# Patient Record
Sex: Female | Born: 1980 | Race: White | Hispanic: No | Marital: Single | Smoking: Never smoker
Health system: Southern US, Community
[De-identification: ages and names within clinical notes are randomized; demographics above are authoritative.]

## PROBLEM LIST (undated history)

## (undated) DIAGNOSIS — Q249 Congenital malformation of heart, unspecified: Secondary | ICD-10-CM

## (undated) DIAGNOSIS — I330 Acute and subacute infective endocarditis: Secondary | ICD-10-CM

## (undated) DIAGNOSIS — N289 Disorder of kidney and ureter, unspecified: Secondary | ICD-10-CM

## (undated) DIAGNOSIS — G253 Myoclonus: Secondary | ICD-10-CM

## (undated) DIAGNOSIS — Q513 Bicornate uterus: Secondary | ICD-10-CM

## (undated) HISTORY — PX: TUBAL LIGATION: SHX77

## (undated) HISTORY — PX: SPINAL GROWTH RODS: SHX2427

## (undated) HISTORY — PX: CHOLECYSTECTOMY: SHX55

## (undated) HISTORY — PX: CARDIAC SURGERY: SHX584

---

## 2015-01-09 ENCOUNTER — Emergency Department (HOSPITAL_COMMUNITY)
Admission: EM | Admit: 2015-01-09 | Discharge: 2015-01-09 | Disposition: A | Discharge status: Home / Self Care | Payer: PRIVATE HEALTH INSURANCE | Attending: Emergency Medicine | Admitting: Emergency Medicine

## 2015-01-09 ENCOUNTER — Encounter (HOSPITAL_COMMUNITY): Payer: Self-pay | Admitting: Emergency Medicine

## 2015-01-09 ENCOUNTER — Emergency Department (HOSPITAL_COMMUNITY): Payer: PRIVATE HEALTH INSURANCE

## 2015-01-09 DIAGNOSIS — S60121A Contusion of right index finger with damage to nail, initial encounter: Secondary | ICD-10-CM | POA: Insufficient documentation

## 2015-01-09 DIAGNOSIS — Q513 Bicornate uterus: Secondary | ICD-10-CM | POA: Insufficient documentation

## 2015-01-09 DIAGNOSIS — Z8669 Personal history of other diseases of the nervous system and sense organs: Secondary | ICD-10-CM | POA: Insufficient documentation

## 2015-01-09 DIAGNOSIS — W230XXA Caught, crushed, jammed, or pinched between moving objects, initial encounter: Secondary | ICD-10-CM | POA: Diagnosis not present

## 2015-01-09 DIAGNOSIS — Z8774 Personal history of (corrected) congenital malformations of heart and circulatory system: Secondary | ICD-10-CM | POA: Insufficient documentation

## 2015-01-09 DIAGNOSIS — S6010XA Contusion of unspecified finger with damage to nail, initial encounter: Secondary | ICD-10-CM

## 2015-01-09 DIAGNOSIS — Z87448 Personal history of other diseases of urinary system: Secondary | ICD-10-CM | POA: Insufficient documentation

## 2015-01-09 DIAGNOSIS — Y9389 Activity, other specified: Secondary | ICD-10-CM | POA: Diagnosis not present

## 2015-01-09 DIAGNOSIS — Z8679 Personal history of other diseases of the circulatory system: Secondary | ICD-10-CM | POA: Insufficient documentation

## 2015-01-09 DIAGNOSIS — Y9289 Other specified places as the place of occurrence of the external cause: Secondary | ICD-10-CM | POA: Diagnosis not present

## 2015-01-09 DIAGNOSIS — S6991XA Unspecified injury of right wrist, hand and finger(s), initial encounter: Secondary | ICD-10-CM | POA: Diagnosis present

## 2015-01-09 DIAGNOSIS — Y998 Other external cause status: Secondary | ICD-10-CM | POA: Diagnosis not present

## 2015-01-09 HISTORY — DX: Disorder of kidney and ureter, unspecified: N28.9

## 2015-01-09 HISTORY — DX: Myoclonus: G25.3

## 2015-01-09 HISTORY — DX: Acute and subacute infective endocarditis: I33.0

## 2015-01-09 HISTORY — DX: Congenital malformation of heart, unspecified: Q24.9

## 2015-01-09 HISTORY — DX: Bicornate uterus: Q51.3

## 2015-01-09 MED ORDER — OXYCODONE-ACETAMINOPHEN 5-325 MG PO TABS
1.0000 | ORAL_TABLET | Freq: Once | ORAL | Status: AC
  Administered 2015-01-09: 1 via ORAL
  Filled 2015-01-09: qty 1

## 2015-01-09 NOTE — ED Provider Notes (Signed)
CSN: [id number]     Arrival date & time 01/09/15  1349 History  By signing my name below, I, Soijett Blue, attest that this documentation has been prepared under the direction and in the presence of Catha Gosselin, PA-C Electronically Signed: Soijett Blue, ED Scribe. 01/09/2015. 3:17 PM.   Chief Complaint  Patient presents with  . Finger Injury    The history is provided by the patient. No language interpreter was used.    Shelby Cole is a 34 y.o. female with a medical hx of tetrology of fallot, DiGeorge syndrome who presents to the Emergency Department complaining of right index finger injury onset yesterday afternoon. She reports that she shut her finger in a car door that swung back and closed on her finger accidentally while she was getting items out of her car. She states that her right index finger is throbbing. She notes that she takes 5-325 mg percocet from a pain management clinic in [place] due to rods in back and  Osteoarthritis. Pt is having associated symptoms of bruising, right index swelling, and color change. She notes that she has not tried any medications for the relief of her symptoms. She denies wound, rash, and any other symptoms. She reports that she is on ASA 81 mg and digoxin.   Past Medical History  Diagnosis Date  . Heart abnormality     Tetrology of Fallot  . Myoclonia   . Renal disorder   . Uterus bicornis   . SBE (subacute bacterial endocarditis) (HCC)     With staphorious   Past Surgical History  Procedure Laterality Date  . Cardiac surgery    . Spinal growth rods    . Cholecystectomy    . Tubal ligation     History reviewed. No pertinent family history. Social History  Substance Use Topics  . Smoking status: None  . Smokeless tobacco: None  . Alcohol Use: None   OB History    No data available     Review of Systems  Musculoskeletal: Positive for joint swelling and arthralgias.  Skin: Positive for color change. Negative for rash  and wound.      Allergies  Morphine and related  Home Medications   Prior to Admission medications   Not on File   BP 134/83 mmHg  Pulse 87  Temp(Src) 98.1 F (36.7 C) (Oral)  Resp 18  SpO2 96%  LMP 01/02/2015 Physical Exam  Constitutional: She is oriented to person, place, and time. She appears well-developed and well-nourished. No distress.  HENT:  Head: Normocephalic and atraumatic.  Eyes: EOM are normal.  Neck: Neck supple.  Cardiovascular: Normal rate.   Pulmonary/Chest: Effort normal. No respiratory distress.  Abdominal: Soft. There is no tenderness.  Musculoskeletal: Normal range of motion.  Subungual hematoma of right index finger but no signs of cellulitis. Soft compartments. Erythema at the distal phalanx but able to flex and extend the finger. No active bleeding or laceration.   Neurological: She is alert and oriented to person, place, and time.  Skin: Skin is warm and dry.  Psychiatric: She has a normal mood and affect. Her behavior is normal.  Nursing note and vitals reviewed.   ED Course  Cauterization Date/Time: 01/09/2015 7:49 PM Performed by: Catha Gosselin Authorized by: Catha Gosselin Consent: Verbal consent obtained. Written consent obtained. Risks and benefits: risks, benefits and alternatives were discussed Consent given by: patient Patient understanding: patient states understanding of the procedure being performed Imaging studies: imaging studies available Patient identity confirmed:  verbally with patient Local anesthesia used: no Patient sedated: no Patient tolerance: Patient tolerated the procedure well with no immediate complications Comments: Right index finger.   (including critical care time) DIAGNOSTIC STUDIES: Oxygen Saturation is 96% on RA, nl by my interpretation.    COORDINATION OF CARE: 3:16 PM Discussed treatment plan with pt at bedside which includes right index finger xray and pt agreed to plan.    Labs  Review Labs Reviewed - No data to display  Imaging Review Dg Finger Index Right  01/09/2015  CLINICAL DATA:  Crush injury to right second finger in car door eat yesterday with pain and swelling. Initial encounter. EXAM: RIGHT INDEX FINGER 2+V COMPARISON:  None. FINDINGS: Soft tissue swelling present without acute fracture or dislocation. No arthropathy or bony lesions. No soft tissue foreign bodies identified. IMPRESSION: No acute fracture identified. Electronically Signed   By: Irish LackGlenn  Yamagata M.D.   On: 01/09/2015 14:56   I have personally reviewed and evaluated these images as part of my medical decision-making.   EKG Interpretation None      MDM   Final diagnoses:  Subungual hematoma of digit of hand, initial encounter  Patient presents after slamming her right index finger in the car door. She had a sublingual hematoma but no signs of compartment syndrome. Her x-ray was negative for fracture but did show soft tissue swelling. She had no signs of cellulitis. I discussed following up with her primary care physician upon [place] tomorrow. Mom and daughter both verbally agree with the plan. Medications  oxyCODONE-acetaminophen (PERCOCET/ROXICET) 5-325 MG per tablet 1 tablet (1 tablet Oral Given 01/09/15 1532)   I personally performed the services described in this documentation, which was scribed in my presence. The recorded information has been reviewed and is accurate.    Catha GosselinHanna Patel-Mills, PA-C 01/09/15 1951  Bethann BerkshireJoseph Zammit, MD 01/09/15 [phone]

## 2015-01-09 NOTE — ED Notes (Signed)
Patient was alert, oriented and stable upon discharge. RN went over AVS and patient had no further questions.  

## 2015-01-09 NOTE — ED Notes (Signed)
Pt shut finger in car door yesterday, finger got stuck. Today right index fingernail severely bruised/red/swollen. Family member states pt was born with Tetrology of Fallot, has had multiple surgeries on heart, family member concerned it may affect her heart rhythm.

## 2015-01-09 NOTE — Discharge Instructions (Signed)
Subungual Hematoma A subungual hematoma is a pocket of blood that collects under the fingernail or toenail. The pressure created by the blood under the nail can cause pain. CAUSES  A subungual hematoma occurs when an injury to the finger or toe causes a blood vessel beneath the nail to break. The injury can occur from a direct blow such as slamming a finger in a door. It can also occur from a repeated injury such as pressure on the foot in a shoe while running. A subungual hematoma is sometimes called runner's toe or tennis toe. SYMPTOMS   Blue or dark blue skin under the nail.  Pain or throbbing in the injured area. DIAGNOSIS  Your caregiver can determine whether you have a subungual hematoma based on your history and a physical exam. If your caregiver thinks you might have a broken (fractured) bone, X-rays may be taken. TREATMENT  Hematomas usually go away on their own over time. Your caregiver may make a hole in the nail to drain the blood. Draining the blood is painless and usually provides significant relief from pain and throbbing. The nail usually grows back normally after this procedure. In some cases, the nail may need to be removed. This is done if there is a cut under the nail that requires stitches (sutures). HOME CARE INSTRUCTIONS   Put ice on the injured area.  Put ice in a plastic bag.  Place a towel between your skin and the bag.  Leave the ice on for 15-20 minutes, 03-04 times a day for the first 1 to 2 days.  Elevate the injured area to help decrease pain and swelling.  If you were given a bandage, wear it for as long as directed by your caregiver.  If part of your nail falls off, trim the remaining nail gently. This prevents the nail from catching on something and causing further injury.  Only take over-the-counter or prescription medicines for pain, discomfort, or fever as directed by your caregiver. SEEK IMMEDIATE MEDICAL CARE IF:   You have redness or swelling  around the nail.  You have yellowish-white fluid (pus) coming from the nail.  Your pain is not controlled with medicine.  You have a fever. MAKE SURE YOU:  Understand these instructions.  Will watch your condition.  Will get help right away if you are not doing well or get worse.   This information is not intended to replace advice given to you by your health care provider. Make sure you discuss any questions you have with your health care provider.   Document Released: 02/23/2000 Document Revised: 05/20/2011 Document Reviewed: 07/13/2014 Elsevier Interactive Patient Education 2016 [employer]  

## 2015-10-23 DIAGNOSIS — Q213 Tetralogy of Fallot: Secondary | ICD-10-CM | POA: Insufficient documentation

## 2015-10-23 DIAGNOSIS — J984 Other disorders of lung: Secondary | ICD-10-CM | POA: Insufficient documentation

## 2015-10-23 DIAGNOSIS — G253 Myoclonus: Secondary | ICD-10-CM | POA: Insufficient documentation

## 2015-10-23 DIAGNOSIS — Q9381 Velo-cardio-facial syndrome: Secondary | ICD-10-CM | POA: Insufficient documentation

## 2016-07-18 DIAGNOSIS — Q600 Renal agenesis, unilateral: Secondary | ICD-10-CM | POA: Insufficient documentation

## 2017-08-03 ENCOUNTER — Emergency Department (HOSPITAL_COMMUNITY): Payer: Medicare Other

## 2017-08-03 ENCOUNTER — Emergency Department (HOSPITAL_COMMUNITY)
Admission: EM | Admit: 2017-08-03 | Discharge: 2017-08-03 | Disposition: A | Discharge status: Home / Self Care | Payer: Medicare Other | Attending: Emergency Medicine | Admitting: Emergency Medicine

## 2017-08-03 ENCOUNTER — Encounter (HOSPITAL_COMMUNITY): Payer: Self-pay | Admitting: Emergency Medicine

## 2017-08-03 DIAGNOSIS — N83299 Other ovarian cyst, unspecified side: Secondary | ICD-10-CM | POA: Diagnosis not present

## 2017-08-03 DIAGNOSIS — R1011 Right upper quadrant pain: Secondary | ICD-10-CM | POA: Diagnosis not present

## 2017-08-03 DIAGNOSIS — R7989 Other specified abnormal findings of blood chemistry: Secondary | ICD-10-CM | POA: Diagnosis not present

## 2017-08-03 DIAGNOSIS — R109 Unspecified abdominal pain: Secondary | ICD-10-CM

## 2017-08-03 DIAGNOSIS — N83209 Unspecified ovarian cyst, unspecified side: Secondary | ICD-10-CM

## 2017-08-03 LAB — URINALYSIS, ROUTINE W REFLEX MICROSCOPIC
BILIRUBIN URINE: NEGATIVE
GLUCOSE, UA: NEGATIVE mg/dL
Hgb urine dipstick: NEGATIVE
KETONES UR: 5 mg/dL — AB
NITRITE: NEGATIVE
PH: 5 (ref 5.0–8.0)
Protein, ur: 30 mg/dL — AB
SPECIFIC GRAVITY, URINE: 1.02 (ref 1.005–1.030)

## 2017-08-03 LAB — COMPREHENSIVE METABOLIC PANEL
ALK PHOS: 65 U/L (ref 38–126)
ALT: 38 U/L (ref 14–54)
AST: 28 U/L (ref 15–41)
Albumin: 4.4 g/dL (ref 3.5–5.0)
Anion gap: 12 (ref 5–15)
BUN: 16 mg/dL (ref 6–20)
CHLORIDE: 103 mmol/L (ref 101–111)
CO2: 25 mmol/L (ref 22–32)
CREATININE: 1.83 mg/dL — AB (ref 0.44–1.00)
Calcium: 9 mg/dL (ref 8.9–10.3)
GFR calc Af Amer: 40 mL/min — ABNORMAL LOW (ref >60)
GFR calc non Af Amer: 34 mL/min — ABNORMAL LOW (ref >60)
Glucose, Bld: 113 mg/dL — ABNORMAL HIGH (ref 65–99)
Potassium: 4.4 mmol/L (ref 3.5–5.1)
SODIUM: 140 mmol/L (ref 135–145)
Total Bilirubin: 0.7 mg/dL (ref 0.3–1.2)
Total Protein: 7.9 g/dL (ref 6.5–8.1)

## 2017-08-03 LAB — I-STAT BETA HCG BLOOD, ED (MC, WL, AP ONLY): I-stat hCG, quantitative: <5 m[IU]/mL (ref <5)

## 2017-08-03 LAB — CBC
HCT: 34 % — ABNORMAL LOW (ref 36.0–46.0)
Hemoglobin: 9.6 g/dL — ABNORMAL LOW (ref 12.0–15.0)
MCH: 22.9 pg — AB (ref 26.0–34.0)
MCHC: 28.2 g/dL — ABNORMAL LOW (ref 30.0–36.0)
MCV: 81 fL (ref 78.0–100.0)
PLATELETS: 150 10*3/uL (ref 150–400)
RBC: 4.2 MIL/uL (ref 3.87–5.11)
RDW: 18.7 % — AB (ref 11.5–15.5)
WBC: 4.6 10*3/uL (ref 4.0–10.5)

## 2017-08-03 LAB — LIPASE, BLOOD: LIPASE: 51 U/L (ref 11–51)

## 2017-08-03 MED ORDER — ONDANSETRON HCL 4 MG/2ML IJ SOLN
4.0000 mg | Freq: Once | INTRAMUSCULAR | Status: AC
  Administered 2017-08-03: 4 mg via INTRAVENOUS
  Filled 2017-08-03: qty 2

## 2017-08-03 MED ORDER — KETOROLAC TROMETHAMINE 30 MG/ML IJ SOLN
30.0000 mg | Freq: Once | INTRAMUSCULAR | Status: AC
  Administered 2017-08-03: 30 mg via INTRAVENOUS
  Filled 2017-08-03: qty 1

## 2017-08-03 MED ORDER — IBUPROFEN 800 MG PO TABS: 800.0000 mg | ORAL_TABLET | Freq: Three times a day (TID) | ORAL | 0 refills | Status: AC | PRN

## 2017-08-03 MED ORDER — SODIUM CHLORIDE 0.9 % IV BOLUS
1000.0000 mL | Freq: Once | INTRAVENOUS | Status: AC
  Administered 2017-08-03: 1000 mL via INTRAVENOUS

## 2017-08-03 NOTE — ED Notes (Signed)
Bed: ZO10 Expected date:  Expected time:  Means of arrival:  Comments: Hold Storlie

## 2017-08-03 NOTE — ED Provider Notes (Signed)
Emergency Department Provider Note   I have reviewed the triage vital signs and the nursing notes.   HISTORY  Chief Complaint Abdominal Pain; Diarrhea; and Nausea   HPI Shelby Cole is a 37 y.o. female with PMH of TOF s/p repair, vWF, and SBE resents to the emergency department for evaluation of right-sided abdominal/flank pain worsening over the past 2 days.  The patient scribes a sudden onset, constant pain which is radiating slightly around the inguinal crease.  No prior history of kidney stone.  She has had multiple surgeries of her heart and is status post cholecystectomy.  She does have an appendix.  Denies any fevers or chills.  She did have some associated diarrhea and gas but is passing that normally.  No blood in the diarrhea.  No associated vomiting.  No chest pain or dyspnea.  Past Medical History:  Diagnosis Date  . Heart abnormality    Tetrology of Fallot  . Myoclonia   . Renal disorder   . SBE (subacute bacterial endocarditis)    With staphorious  . Uterus bicornis     There are no active problems to display for this patient.   Past Surgical History:  Procedure Laterality Date  . CARDIAC SURGERY    . CHOLECYSTECTOMY    . SPINAL GROWTH RODS    . TUBAL LIGATION        Allergies Morphine and related  No family history on file.  Social History Social History   Tobacco Use  . Smoking status: Never Smoker  . Smokeless tobacco: Never Used  Substance Use Topics  . Alcohol use: Not on file  . Drug use: Not on file    Review of Systems  Constitutional: No fever/chills Eyes: No visual changes. ENT: No sore throat. Cardiovascular: Denies chest pain. Respiratory: Denies shortness of breath. Gastrointestinal: Positive RLQ abdominal pain.  No nausea, no vomiting. Positive diarrhea.  No constipation. Genitourinary: Negative for dysuria. No vaginal discharge.  Musculoskeletal: Negative for back pain. Skin: Negative for rash. Neurological: Negative  for headaches, focal weakness or numbness.  10-point ROS otherwise negative.  ____________________________________________   PHYSICAL EXAM:  VITAL SIGNS: ED Triage Vitals  Enc Vitals Group     BP 08/03/17 1609 127/78     Pulse Rate 08/03/17 1609 (!) 102     Resp 08/03/17 1609 16     Temp 08/03/17 1609 98.3 F (36.8 C)     Temp Source 08/03/17 1609 Oral     SpO2 08/03/17 1609 95 %     Weight 08/03/17 1609 138 lb (62.6 kg)     Height 08/03/17 1609  (1.626 m)     Pain Score 08/03/17 1619 10   Constitutional: Alert and oriented. Well appearing and in no acute distress. Eyes: Conjunctivae are normal.  Head: Atraumatic. Nose: No congestion/rhinnorhea. Mouth/Throat: Mucous membranes are moist. Neck: No stridor.  Cardiovascular: Sinus tachycardia. Good peripheral circulation. Grossly normal heart sounds.   Respiratory: Normal respiratory effort.  No retractions. Lungs CTAB. Gastrointestinal: Soft with mild RLQ tenderness without rebound or guarding. Mild right CVA tenderness to percussion. No distention.  Musculoskeletal: No lower extremity tenderness nor edema. No gross deformities of extremities. Neurologic:  Normal speech and language. No gross focal neurologic deficits are appreciated.  Skin:  Skin is warm, dry and intact. No rash noted.  ____________________________________________   LABS (all labs ordered are listed, but only abnormal results are displayed)  Labs Reviewed  COMPREHENSIVE METABOLIC PANEL - Abnormal; Notable for the following  components:      Result Value   Glucose, Bld 113 (*)    Creatinine, Ser 1.83 (*)    GFR calc non Af Amer 34 (*)    GFR calc Af Amer 40 (*)    All other components within normal limits  CBC - Abnormal; Notable for the following components:   Hemoglobin 9.6 (*)    HCT 34.0 (*)    MCH 22.9 (*)    MCHC 28.2 (*)    RDW 18.7 (*)    All other components within normal limits  URINALYSIS, ROUTINE W REFLEX MICROSCOPIC - Abnormal;  Notable for the following components:   Ketones, ur 5 (*)    Protein, ur 30 (*)    Leukocytes, UA TRACE (*)    Bacteria, UA RARE (*)    All other components within normal limits  LIPASE, BLOOD  I-STAT BETA HCG BLOOD, ED ([facility], [facility], [facility] ONLY)   ____________________________________________  RADIOLOGY  Ct Renal Stone Study  Result Date: 08/03/2017 CLINICAL DATA:  Right-sided abdominal pain. EXAM: CT ABDOMEN AND PELVIS WITHOUT CONTRAST TECHNIQUE: Multidetector CT imaging of the abdomen and pelvis was performed following the standard protocol without oral or IV contrast. COMPARISON:  None. FINDINGS: Lower chest: Lung bases are clear. Hepatobiliary: No focal liver lesions are evident on this noncontrast enhanced study. Gallbladder is absent. There is no biliary duct dilatation. Pancreas: No pancreatic mass or inflammatory focus. Spleen: No splenic lesions are evident. 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 There is a solid structure in the right renal fossa measuring 1.1 x 0.8 cm which may represent minimal residual focus of atrophic right kidney. No recognizable renal tissue evident. There is compensatory hypertrophy of the left kidney. No hydronephrosis or mass on the left. No left-sided renal or ureteral calculus. Urinary bladder is midline. Urinary bladder wall is mildly thickened. There is a urachal remnant without mass or inflammatory change involving this urachal remnant. Stomach/Bowel: There is no appreciable bowel wall or mesenteric thickening. No evident bowel obstruction. No free air or portal venous air. Vascular/Lymphatic: There are foci of atherosclerotic calcification in the aorta and right common iliac artery. No aneurysm evident. Major mesenteric arterial vessels appear patent. There is no appreciable adenopathy in the abdomen or pelvis. Reproductive: Uterus is anteverted. No evident pelvic mass. There is moderate free fluid in the cul-de-sac. Other: Appendix appears unremarkable. No abscess in the  abdomen or pelvis. There is a small ventral hernia containing only fat. Musculoskeletal: There is extensive fixation in the lower thoracic and lumbar regions. There is thoracolumbar levoscoliosis. No evident blastic or lytic bone lesions. No intramuscular or abdominal wall lesions are evident. IMPRESSION: 1. Virtually absent right kidney. A 1.1 x 0.7 cm solid focus is noted in the right pararenal fossa, likely slight residua of atrophic right kidney. No recognizable renal tissue in this area seen. 2. Compensatory hypertrophy left kidney. No left renal mass or hydronephrosis. No left renal or ureteral calculus. 3. Suspect a degree of cystitis with urinary bladder wall thickening. 4. Urachal remnant without cyst or mass. No inflammatory focus in this area. 5.  No bowel obstruction.  No abscess.  Appendix appears normal. 6.  Aortic atherosclerosis. 7.  Free fluid in cul-de-sac.  Question recent ovarian cyst rupture. 8.  Small ventral hernia containing only fat. 9.  Gallbladder absent. 10. Thoracolumbar levoscoliosis with extensive postoperative change in the lower thoracic and lumbar regions. Aortic Atherosclerosis (ICD10-I70.0). Electronically Signed   By: Bretta Bang III M.D.   On: 08/03/2017  18:17    ____________________________________________   PROCEDURES  Procedure(s) performed:   Procedures  None ____________________________________________   INITIAL IMPRESSION / ASSESSMENT AND PLAN / ED COURSE  Pertinent labs & imaging results that were available during my care of the patient were reviewed by me and considered in my medical decision making (see chart for details).  Patient presents to the emergency department for evaluation of right flank discomfort starting 2 days ago which has been constant.  She is afebrile here with mild tachycardia.  Her exam is positive for mild right CVA tenderness and mild right lower quadrant discomfort. Plan for CT renal protocol for further evaluation. Will  treat with Toradol and IVF. Labs pending.   06:30 PM Patient CT imaging and lab work.  The patient's creatinine is elevated from her 2018 values.  BUN is normal.  Electrolytes normal.  Suspect developing chronic kidney disease.  I discussed this with the patient and mother in detail.  They will call their cardiology group on Tuesday to schedule an urgent outpatient appointment and additional blood testing.  Patient will resume taking her home pain medications.  Suspect ovarian cyst rupture with fluid in the pelvis.  Normal appendix.   At this time, I do not feel there is any life-threatening condition present. I have reviewed and discussed all results (EKG, imaging, lab, urine as appropriate), exam findings with patient. I have reviewed nursing notes and appropriate previous records.  I feel the patient is safe to be discharged home without further emergent workup. Discussed usual and customary return precautions. Patient and family (if present) verbalize understanding and are comfortable with this plan.  Patient will follow-up with their primary care provider. If they do not have a primary care provider, information for follow-up has been provided to them. All questions have been answered.  ____________________________________________  FINAL CLINICAL IMPRESSION(S) / ED DIAGNOSES  Final diagnoses:  Right flank pain  Elevated serum creatinine  Ruptured ovarian cyst     MEDICATIONS GIVEN DURING THIS VISIT:  Medications  sodium chloride 0.9 % bolus 1,000 mL (0 mLs Intravenous Stopped 08/03/17 1901)  ketorolac (TORADOL) 30 MG/ML injection 30 mg (30 mg Intravenous Given 08/03/17 1740)  ondansetron (ZOFRAN) injection 4 mg (4 mg Intravenous Given 08/03/17 1740)     NEW OUTPATIENT MEDICATIONS STARTED DURING THIS VISIT:  Discharge Medication List as of 08/03/2017  6:50 PM    START taking these medications   Details  ibuprofen (ADVIL,MOTRIN) 800 MG tablet Take 1 tablet (800 mg total) by mouth  every 8 (eight) hours as needed for up to 3 days for moderate pain., Starting Sun 08/03/2017, Until Wed 08/06/2017, Print        Note:  This document was prepared using Dragon voice recognition software and may include unintentional dictation errors.  Alona Bene, MD Emergency Medicine    Long, Arlyss Repress, MD 08/04/17 [phone]

## 2017-08-03 NOTE — ED Triage Notes (Signed)
Pt c/o right abd pains since Thursday night that is worse with movements and sitting certain ways. Had nausea and diarrhea. Hx gallbladder removed.  Had period last week. Pt on opana for chronic pain and not helping the abd pains.

## 2017-08-03 NOTE — Discharge Instructions (Addendum)
You have been seen in the Emergency Department (ED) for abdominal pain.  Your evaluation did not identify a clear cause of your symptoms but was generally reassuring. You likely have a ruptured ovarian cyst. Follow up with your Gyn provider for this. Take Motrin only as needed for a very short period of time. Your Kidney function is worse when compared to prior. You will need to call your PCP and Cardiologist to review this lab abnormality ASAP.   Please follow up as instructed above regarding today?s emergent visit and the symptoms that are bothering you.  Return to the ED if your abdominal pain worsens or fails to improve, you develop bloody vomiting, bloody diarrhea, you are unable to tolerate fluids due to vomiting, fever greater than 101, or other symptoms that concern you.

## 2017-08-21 DIAGNOSIS — K089 Disorder of teeth and supporting structures, unspecified: Secondary | ICD-10-CM | POA: Insufficient documentation

## 2018-10-05 DIAGNOSIS — H903 Sensorineural hearing loss, bilateral: Secondary | ICD-10-CM | POA: Insufficient documentation

## 2018-12-08 DIAGNOSIS — D6800 Von Willebrand disease, unspecified: Secondary | ICD-10-CM | POA: Insufficient documentation

## 2019-07-13 DIAGNOSIS — I50813 Acute on chronic right heart failure: Secondary | ICD-10-CM | POA: Insufficient documentation

## 2019-07-13 DIAGNOSIS — I519 Heart disease, unspecified: Secondary | ICD-10-CM | POA: Insufficient documentation

## 2019-08-25 ENCOUNTER — Encounter: Payer: Self-pay | Admitting: Gastroenterology

## 2019-09-29 ENCOUNTER — Encounter: Payer: Self-pay | Admitting: Gastroenterology

## 2019-09-29 ENCOUNTER — Ambulatory Visit (INDEPENDENT_AMBULATORY_CARE_PROVIDER_SITE_OTHER): Payer: Medicare Other | Admitting: Gastroenterology

## 2019-09-29 VITALS — BP 112/82 | HR 76 | Ht 63.5 in | Wt 161.0 lb

## 2019-09-29 DIAGNOSIS — K5900 Constipation, unspecified: Secondary | ICD-10-CM

## 2019-09-29 DIAGNOSIS — R1310 Dysphagia, unspecified: Secondary | ICD-10-CM | POA: Diagnosis not present

## 2019-09-29 MED ORDER — LUBIPROSTONE 8 MCG PO CAPS
8.0000 ug | ORAL_CAPSULE | Freq: Two times a day (BID) | ORAL | 2 refills | Status: DC
Start: 2019-09-29 — End: 2019-11-09

## 2019-09-29 NOTE — Progress Notes (Signed)
09/29/2019 Shelby Cole [id number] 19-Jul-1980   HISTORY OF PRESENT ILLNESS: This is a 39 year old female who is new to our office.  She has been referred here by her cardiologist at [facility], Dr. Deatra James, for evaluation regarding constipation.  She is here today with her mother.  Medical history as listed below.  Had tetralogy of Fallot with surgeries related to that.  Has also had cholecystectomy, tubal ligation, and back surgery for spinal growth rods.  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  They say that shortly after the procedure she started having these episodes of significant abdominal muscle twitching that looks as if she has a baby or an alien in her abdomen.  This causes pain.  Ultimately she saw a neurologist who diagnosed this and she has been on Keppra, which seems to control her symptoms from this for the most part.  She was told to never have a colonoscopy again.  She says that she seems to have problems with this whenever she eats too much at a time/eats big meals.    Anyway, she battles constipation.  She says that she when she gets constipated she has gets a lot of stomach pain.  She says that a couple of times a month sometimes she will get episodes of pain that are so bad that she almost passes out.  She says that she will get hot sweats and stuff as well.  She says her abdomen gets very distended and bloated.  She had taken MiraLAX for a long time, but it stopped working.  She now takes 2 Senokot and 3 stool softeners every night.  Even with that sometimes she has 3 days at a time where she does not have a bowel movement.  Then she takes milk of magnesia, which does seem to work.  She does  require and take chronic narcotics/opioids.  She also reports issues with swallowing.  She reports frequently clearing her throat.  She is on omeprazole 20 mg twice daily.  She admits to food and pills intermittently getting stuck.  She sees a [facility] and they have been able to push on the back of her neck and show her were to manipulate that and when she does that it seems that the food tends to go down and seems like the esophagus opens up.  She had a CT scan of the abdomen and pelvis without contrast in May 2019 that did not show any type of bowel/gastrointestinal issues.  It also appears that she had an EGD in April 2013 as well, but I only have pathology results from that procedure.  Biopsies of the esophagus showed benign squamous epithelium with no pathologic diagnosis, negative for eosinophilic esophagitis.  Past Medical History:  Diagnosis Date  . Heart abnormality    Tetrology of Fallot  . Myoclonia   . Renal disorder   . SBE (subacute bacterial endocarditis)    With staphorious  . Uterus bicornis    Past Surgical History:  Procedure Laterality Date  . CARDIAC SURGERY    . CHOLECYSTECTOMY    . SPINAL GROWTH RODS    . TUBAL LIGATION      reports that she has never smoked.  She has never used smokeless tobacco. She reports current alcohol use. She reports that she does not use drugs. family history is not on file. Allergies  Allergen Reactions  . Morphine And Related Other (See Comments)    Hallucination Hallucination      Outpatient Encounter Medications as of 09/29/2019  Medication Sig  . aspirin 81 MG chewable tablet Chew 81 mg by mouth daily.  . citalopram (CELEXA) 40 MG tablet Take 40 mg by mouth daily.  . cyclobenzaprine (FLEXERIL) 5 MG tablet Take 5 mg by mouth at bedtime as needed for pain.  Marland Kitchen levETIRAcetam (KEPPRA) 500 MG tablet Take 500 mg by mouth 3 (three) times daily.  Marland Kitchen lidocaine (LIDODERM) 5 % Place 1 patch onto the skin daily. Remove & Discard patch  within 12 hours or as directed by MD  . lisinopril (PRINIVIL,ZESTRIL) 5 MG tablet Take by mouth daily.   Marland Kitchen loratadine (CLARITIN) 10 MG tablet Take 10 mg by mouth daily.  . metoprolol-hydrochlorothiazide (LOPRESSOR HCT) 100-25 MG tablet Take 1 tablet by mouth daily.  Marland Kitchen omeprazole (PRILOSEC) 20 MG capsule Take 20 mg by mouth 2 (two) times daily.  Marland Kitchen oxymorphone (OPANA ER) 15 MG 12 hr tablet Take 15 mg by mouth every 12 (twelve) hours.  . ropinirole (REQUIP) 5 MG tablet Take 5 mg by mouth at bedtime.  Marland Kitchen spironolactone (ALDACTONE) 50 MG tablet Take 50 mg by mouth daily.  Marland Kitchen torsemide (DEMADEX) 20 MG tablet Take 20 mg by mouth daily. 2-3 times a day  . [DISCONTINUED] diclofenac sodium (VOLTAREN) 1 % GEL Apply 2 g topically 4 (four) times daily.  . [DISCONTINUED] furosemide (LASIX) 40 MG tablet Take 40 mg by mouth daily.  . [DISCONTINUED] oxyCODONE-acetaminophen (PERCOCET/ROXICET) 5-325 MG tablet Take 1 tablet by mouth 3 (three) times daily as needed for pain.  . [DISCONTINUED] venlafaxine XR (EFFEXOR-XR) 75 MG 24 hr capsule Take 75 mg by mouth daily.   No facility-administered encounter medications on file as of 09/29/2019.     REVIEW OF SYSTEMS  : All other systems reviewed and negative except where noted in the History of Present Illness.   PHYSICAL EXAM: BP 112/82   Pulse 76   Ht 5' 3.5" (1.613 m)   Wt 161 lb (73 kg)   BMI 28.07 kg/m  General: Well developed white female in no acute distress Head: Normocephalic and atraumatic Eyes:  Sclerae anicteric, conjunctiva pink. Ears: Normal auditory acuity Lungs: Clear throughout to auscultation; no increased WOB. Heart: Regular rate and rhythm; no M/R/G. Abdomen: Soft, minimal if any abdominal distention currently.  BS present.  Non-tender. Musculoskeletal: Symmetrical with no gross deformities  Skin: No lesions on visible extremities Extremities: No edema  Neurological: Alert oriented x 4, grossly non-focal Psychological:  Alert and  cooperative. Normal mood and affect  ASSESSMENT AND PLAN: *Dysphagia:  Had an EGD in 2013 and from what I can tell there was no source of her dysphagia identified.  She describes being able to push on the back of her neck to allow food to go down when she has episodes of dysphagia.  Sounds like it could be some extrinsic compression, question from hardware from previous surgery.  We will plan for barium esophagram. *Chronic constipation: Undoubtedly secondary to her chronic narcotic/opioid use.  Used MiraLAX for years.  Currently using senna and Colace.  She describes episodes of severe abdominal pain when she gets really constipated that cause her to have sweating, headaches, and abdominal distention.  These resolve after being able  to move her bowels.  Question if she is getting intermittent partial small bowel obstructions.  She has never been diagnosed or treated for such, however.  We will try Amitiza 8 mcg twice daily with food for her constipation.  Can titrate up if needed.  Prescription sent to pharmacy. 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  Sounds like this is a neurologic/muscular disorder and not a gastrointestinal disorder per se.  She was being treated by a neurologist for this in the past and continues on Keppra, which they think does help.  They are convinced that this was triggered by her colonoscopy that she had 2013.  She describes that these episodes of muscle twitching in her abdomen that cause pain are worsened when she is constipated or eats too much food at one time/over distends her abdomen, etc.  Advised to continue her current treatment with the Keppra per her PCP who is now prescribing that.  Advised that we will treat her constipation and that she should eat small frequent meals to try to avoid episodes of this.   CC:  Lottie Mussel*

## 2019-09-29 NOTE — Patient Instructions (Addendum)
If you are age 39 or older, your body mass index should be between 23-30. Your Body mass index is 28.07 kg/m. If this is out of the aforementioned range listed, please consider follow up with your Primary Care Provider.  If you are age 73 or younger, your body mass index should be between 19-25. Your Body mass index is 28.07 kg/m. If this is out of the aformentioned range listed, please consider follow up with your Primary Care Provider.   You have been scheduled for a Barium Esophogram at [facility] (1st floor of the hospital) on 10/07/19 at 9:30 am. Please arrive 15 minutes prior to your appointment for registration. Make certain not to have anything to eat or drink starting at midnight prior to your test. If you need to reschedule for any reason, please contact radiology at [phone] to do so. __________________________________________________________________ A barium swallow is an examination that concentrates on views of the esophagus. This tends to be a double contrast exam (barium and two liquids which, when combined, create a gas to distend the wall of the oesophagus) or single contrast (non-ionic iodine based). The study is usually tailored to your symptoms so a good history is essential. Attention is paid during the study to the form, structure and configuration of the esophagus, looking for functional disorders (such as aspiration, dysphagia, achalasia, motility and reflux) EXAMINATION You may be asked to change into a gown, depending on the type of swallow being performed. A radiologist and radiographer will perform the procedure. The radiologist will advise you of the type of contrast selected for your procedure and direct you during the exam. You will be asked to stand, sit or lie in several different positions and to hold a small amount of fluid in your mouth before being asked to swallow while the imaging is performed .In some instances you may be asked to swallow barium  coated marshmallows to assess the motility of a solid food bolus. The exam can be recorded as a digital or video fluoroscopy procedure. POST PROCEDURE It will take 1-2 days for the barium to pass through your system. To facilitate this, it is important, unless otherwise directed, to increase your fluids for the next 24-48hrs and to resume your normal diet.  This test typically takes about 30 minutes to perform. __________________________________________________________________________________  START Amitiza 8 mcg (we have sent in the generic), 1 capsule twice a day.  You have been scheduled to follow up with Dr. Christain Sacramento on November 29, 2019 at 9:50 am.

## 2019-10-07 ENCOUNTER — Ambulatory Visit (HOSPITAL_COMMUNITY)
Admission: RE | Admit: 2019-10-07 | Discharge: 2019-10-07 | Disposition: A | Discharge status: Home / Self Care | Payer: Medicare Other | Source: Ambulatory Visit | Attending: Gastroenterology | Admitting: Gastroenterology

## 2019-10-07 ENCOUNTER — Other Ambulatory Visit: Payer: Self-pay

## 2019-10-07 DIAGNOSIS — K5900 Constipation, unspecified: Secondary | ICD-10-CM | POA: Insufficient documentation

## 2019-10-07 DIAGNOSIS — R1310 Dysphagia, unspecified: Secondary | ICD-10-CM | POA: Diagnosis present

## 2019-11-01 NOTE — Progress Notes (Signed)
Reviewed and agree with management plans. ? ?Baneen Wieseler L. Camila Maita, MD, MPH  ?

## 2019-11-05 ENCOUNTER — Telehealth: Payer: Self-pay | Admitting: Gastroenterology

## 2019-11-05 NOTE — Telephone Encounter (Signed)
Spoke with patient, she states that Amitiza works and then it doesn't. Pt states that she notices that it works better in the mornings. She reports that her stools are hard can caused some bleeding "like her period". Pt states that she was previously taking Senokot and Miralax which was not helpful consistently either, still having constipation. Pt also states that she never heard anything about her barium swallow study results. Please advise, thanks

## 2019-11-05 NOTE — Telephone Encounter (Signed)
Pt is wanting to inform the nurse that the Amitiza is not working for her since she is still getting constipated.

## 2019-11-08 ENCOUNTER — Telehealth: Payer: Self-pay | Admitting: Gastroenterology

## 2019-11-08 NOTE — Telephone Encounter (Signed)
See alternate phone note  

## 2019-11-08 NOTE — Telephone Encounter (Signed)
Shelby Cole I think this is your patient, if you can relay results at your convenience. thanks

## 2019-11-08 NOTE — Telephone Encounter (Signed)
FYI

## 2019-11-08 NOTE — Telephone Encounter (Signed)
Dr. Adela Lank as DOD 11/08/19 please see below and advise.  Shelby Cole's patient history of constipation and dysphagia - see below for details. Mom is calling today for barium swallow study results that were done on 10/07/19. Thanks

## 2019-11-08 NOTE — Telephone Encounter (Signed)
I have reviewed the recent evaluation by Doug Sou.  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  - Mild nonspecific intermittent esophageal dysmotility.  - Tiny sliding hiatal hernia.  - Small volume gastroesophageal reflux to the level of the lower esophagus.  - No evidence of significant fixed stricture. The patient swallowed a 13 mm barium tablet, which freely passed into the stomach.  Given these results I recommend: - Incresing omeprazole to 40 mg BID - Review GERD lifestyle modications - Referral to ENT at [facility] or [facility] to determine if Zenker's may be the source of her dysphagia and for recommendations about possible surgery   In regards to the constipation, Shelby Cole's notes mention starting Amitiza at 8 mcg. Please confirm that this is the dose, and if so, she should increase the dose to 24 mcg BID.  She should be following a high fiber diet, drinking at least 64 ounces of water daily, and using Miralax.   She has follow-up appointment next month. If she feels an earlier appointment is necessary, please see if Shelby Cole has anything available.  Thank you.

## 2019-11-09 MED ORDER — OMEPRAZOLE 40 MG PO CPDR: 40.0000 mg | DELAYED_RELEASE_CAPSULE | Freq: Two times a day (BID) | ORAL | 6 refills | Status: DC

## 2019-11-09 MED ORDER — LUBIPROSTONE 24 MCG PO CAPS: 24.0000 ug | ORAL_CAPSULE | Freq: Two times a day (BID) | ORAL | 6 refills | Status: AC

## 2019-11-09 NOTE — Telephone Encounter (Signed)
The pt's mother has been advised and new prescriptions has been sent to the pharmacy for amitiza and omeprazole.  She also states that she has seen an ENT and will have those records faxed to [facility] for review.  She will keep the appt as planned for next month.

## 2019-11-09 NOTE — Telephone Encounter (Signed)
Thank you for addressing this.  In regards to the esophagram, there was nothing acute that needed to be addressed and I saw that she had a follow-up appointment.  The results were released in my chart and it looks like she is signed up for my chart so I figured if she had any specific questions or further complaints then she would contact us in the interim, but otherwise the results would be discussed at her follow-up.  Thank you for addressing the constipation.

## 2019-11-09 NOTE — Telephone Encounter (Signed)
Left message on machine to call back  

## 2019-11-29 ENCOUNTER — Encounter: Payer: Self-pay | Admitting: Gastroenterology

## 2019-11-29 ENCOUNTER — Other Ambulatory Visit: Payer: Self-pay | Admitting: Gastroenterology

## 2019-11-29 ENCOUNTER — Telehealth: Payer: Self-pay | Admitting: Gastroenterology

## 2019-11-29 ENCOUNTER — Ambulatory Visit (INDEPENDENT_AMBULATORY_CARE_PROVIDER_SITE_OTHER): Payer: Medicare Other | Admitting: Gastroenterology

## 2019-11-29 ENCOUNTER — Ambulatory Visit: Payer: No Typology Code available for payment source | Admitting: Gastroenterology

## 2019-11-29 VITALS — BP 110/70 | HR 88 | Ht 62.75 in | Wt 164.2 lb

## 2019-11-29 DIAGNOSIS — R1310 Dysphagia, unspecified: Secondary | ICD-10-CM | POA: Diagnosis not present

## 2019-11-29 MED ORDER — NALOXEGOL OXALATE 12.5 MG PO TABS: 12.5000 mg | ORAL_TABLET | Freq: Every day | ORAL | 1 refills | Status: DC

## 2019-11-29 NOTE — H&P (View-Only) (Signed)
Duplicate note started

## 2019-11-29 NOTE — Progress Notes (Signed)
Duplicate note started

## 2019-11-29 NOTE — Telephone Encounter (Signed)
I am sorry that she can't come on Wednesday. Please place her on a cancellation list. Thank you.

## 2019-11-29 NOTE — Patient Instructions (Addendum)
I have recommended esophageal manometry to further evaluated your trouble swallowing. 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  I have recommended a trial of naloxegol 12.5 mg daily. Please stop this medication if you develop abdominal pain, diarrhea, or any new concerning symptoms and contact me.  We have sent the following medications to your pharmacy for you to pick up at your convenience:  START: naloxegol 12.5mg  daily  Let's plan to see each other again after you see the ENT.  You have been scheduled for an esophageal manometry test at [facility] on 12-13-19 at 12:30pm. Please arrive 30 minutes prior to your procedure for registration. You will need to go to outpatient registration (1st floor of the hospital) first. Make certain to bring your insurance cards as well as a complete list of medications.  Please remember the following:  1) Do not take any muscle relaxants, xanax (alprazolam) or ativan for 1 day prior to your test as well as the day of the test.  2) Nothing to eat or drink after 12:00 midnight on the night before your test.  3) Hold all diabetic medications/insulin the morning of the test. You may eat and take your medications after the test.  It will take at least 2 weeks to receive the results of this test from your physician.  ------------------------------------------ ABOUT ESOPHAGEAL MANOMETRY Esophageal manometry (muh-NOM-uh-tree) is a test that gauges how well your esophagus works. Your esophagus is the long, muscular tube that connects your throat to your stomach. Esophageal manometry measures the rhythmic muscle contractions (peristalsis) that occur in your esophagus when you swallow. Esophageal manometry also measures the coordination and force exerted by the muscles of your esophagus.  During esophageal manometry, a thin,  flexible tube (catheter) that contains sensors is passed through your nose, down your esophagus and into your stomach. Esophageal manometry can be helpful in diagnosing some mostly uncommon disorders that affect your esophagus.  Why it's done Esophageal manometry is used to evaluate the movement (motility) of food through the esophagus and into the stomach. The test measures how well the circular bands of muscle (sphincters) at the top and bottom of your esophagus open and close, as well as the pressure, strength and pattern of the wave of esophageal muscle contractions that moves food along.  What you can expect Esophageal manometry is an outpatient procedure done without sedation. Most people tolerate it well. You may be asked to change into a hospital gown before the test starts.  During esophageal manometry   While you are sitting up, a member of your health care team sprays your throat with a numbing medication or puts numbing gel in your nose or both.   A catheter is guided through your nose into your esophagus. The catheter may be sheathed in a water-filled sleeve. It doesn't interfere with your breathing. However, your eyes may water, and you may gag. You may have a slight nosebleed from irritation.   After the catheter is in place, you may be asked to lie on your back on an exam table, or you may be asked to remain seated.   You then swallow small sips of water. As you do, a computer connected to the catheter records the pressure, strength and pattern of your esophageal muscle contractions.   During the test, you'll be asked to breathe slowly and smoothly, remain as still as possible,  and swallow only when you're asked to do so.   A member of your health care team may move the catheter down into your stomach while the catheter continues its measurements.   The catheter then is slowly withdrawn. The test usually lasts 20 to 30 minutes.  After esophageal manometry  When your esophageal  manometry is complete, you may return to your normal activities  This test typically takes 30-45 minutes to complete. __________________________________________________________  Due to recent COVID-19 restrictions implemented by our local and state authorities and in an effort to keep both patients and staff as safe as possible, our hospital system now requires COVID-19 testing prior to any scheduled hospital procedure. Please go to [address] [place], [place], [place] [address] on 12-09-19 at  12:05pm. This is a drive up testing site, you will not need to exit your vehicle.  You will not be billed at the time of testing but may receive a bill later depending on your insurance. The approximate cost of the test is $100. You must agree to quarantine from the time of your testing until the procedure date on 12-13-19 . This should include staying at home with ONLY the people you live with. Avoid take-out, grocery store shopping or leaving the house for any non-emergent reason. Failure to have your COVID-19 test done on the date and time you have been scheduled will result in cancellation of procedure. Please call our office at [phone] if you have any questions.   Due to recent changes in healthcare laws, you may see the results of your imaging and laboratory studies on MyChart before your provider has had a chance to review them.  We understand that in some cases there may be results that are confusing or concerning to you. Not all laboratory results come back in the same time frame and the provider may be waiting for multiple results in order to interpret others.  Please give [place] 48 hours in order for your provider to thoroughly review all the results before contacting the office for clarification of your results.   Thank you for entrusting me with your care and choosing [facility].  Dr Orvan Falconer

## 2019-11-29 NOTE — Progress Notes (Signed)
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 Clear instructions provided to discontinue with onset of abdominal pain or diarrhea - Esophageal manometry - Referral to ENT re: Cervical esophageal Zenker's - Office follow-up after evaluation above   HPI: Shelby Cole a 39y.o. female who returns in scheduled follow-up after her initial consultation with [name] This is my first visit with [name]. The interval history is obtained through the patient, her mother who accompanies her to this appointment, and review of CareEverywhere. 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 She has a congenital single kideny.   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 Colonoscopy at that time showed ischemic colitis at the splenic flexure and descending colon and internal hemorrhoids. Has been treated by a neurologist with Keppra to control her symptoms. Understands that she should never have another colonoscopy. Recent MRA abd/pelvis showed no mesenteric ischemia.   Recent GI complaints include dysphagia and  constipation.   Dysphagia: Intermittently feels like foods and pills gets stuck at the sternal notch. Is able to push on the back of her neck to manipulate the passage of the bolus through the esophagus. There may be a component of globus. 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 No difficulty with barium tablet.  Omeprazole increased to 40 mg BID. [facility].  [facility]  Constipation: Chronic constipation with associated abdominal pain and bloating. Will go days between bowel movements. Symptoms can be so severe that she "passes out." Does not tolerate Miralax. Previously using 2 Senokot and 3 stool softeners every night, MOM PRN. More recently, Amitiza not helping at either 865m BID or 24 mcg BID. No blood or mucous in the stool. No symptoms consistent with pelvic floor dysfunction.   Labs from the last 3 months show an eGFR 40-69. Platelets 110-137. Normal hemoglobin.   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 - [facility]  Diagnosis Date  . Heart abnormality    Tetrology of Fallot  . Myoclonia   . Renal disorder   . SBE (subacute bacterial endocarditis)    With staphorious  . Uterus bicornis     Past Surgical History:  Procedure Laterality Date  . CARDIAC SURGERY    . CHOLECYSTECTOMY    . SPINAL GROWTH RODS    . TUBAL LIGATION      Current Outpatient Medications  Medication Sig Dispense Refill  . aspirin 81 MG chewable  tablet Chew 81 mg by mouth daily.    . citalopram (CELEXA) 40 MG tablet Take 40 mg by mouth daily.    . cyclobenzaprine (FLEXERIL) 5 MG tablet Take 5 mg by mouth at bedtime as needed for pain.  2  . levETIRAcetam (KEPPRA) 500 MG tablet Take 500 mg by mouth 3 (three) times daily.  1  . lidocaine (LIDODERM) 5 % Place 1 patch onto the skin daily. Remove & Discard patch within 12 hours or as directed by MD    . lisinopril (PRINIVIL,ZESTRIL) 5 MG tablet Take 2.5 mg by mouth daily.   3  . loratadine (CLARITIN) 10 MG tablet Take 10 mg by mouth daily.    Marland Kitchen lubiprostone (AMITIZA) 24 MCG capsule Take 1 capsule (24 mcg total) by mouth 2 (two) times daily with a meal. 60 capsule 6  . Magnesium Hydroxide (MILK OF MAGNESIA PO) Take 15-30 mLs by mouth as needed (every 4-5 days).    . metoprolol-hydrochlorothiazide (LOPRESSOR HCT) 100-25 MG tablet Take 1 tablet by mouth daily.    . Multiple Vitamin (MULTI-VITAMIN) tablet Take 1 tablet by mouth at bedtime.    Marland Kitchen omeprazole (PRILOSEC) 40 MG capsule Take 1 capsule (40 mg total) by mouth 2 (two) times daily. 60 capsule 6  . oxymorphone (OPANA ER) 20 MG 12 hr tablet SMARTSIG:1 Tablet(s) By Mouth Every 12 Hours    . ropinirole (REQUIP) 5 MG tablet Take 5 mg by mouth 3 (three) times daily as needed.     Marland Kitchen spironolactone (ALDACTONE) 50 MG tablet Take 50 mg by mouth daily.    Marland Kitchen torsemide (DEMADEX) 20 MG tablet Take 40 mg by mouth daily. 2-3 times a day      No current facility-administered medications for this visit.    Allergies as of 11/29/2019 - Review Complete 11/29/2019  Allergen Reaction Noted  . Morphine and related Other (See Comments) 01/09/2015    Family History  Problem Relation Age of Onset  . Colon cancer Neg Hx   . Rectal cancer Neg Hx   . Stomach cancer Neg Hx   . Esophageal cancer Neg Hx   . Pancreatic cancer Neg Hx     Social History   Socioeconomic History  . Marital status: Single    Spouse name: Not on file  . Number of children: Not  on file  . Years of education: Not on file  . Highest education level: Not on file  Occupational History  . Not on file  Tobacco Use  . Smoking status: Never Smoker  . Smokeless tobacco: Never Used  Vaping Use  . Vaping Use: Never used  Substance and Sexual Activity  . Alcohol use: Yes    Comment: special occasions   . Drug use: Never  . Sexual activity: Not on file  Other Topics Concern  . Not on file  Social History Narrative  . Not on file   Social Determinants of Health   Financial Resource Strain:   . Difficulty of Paying Living Expenses: Not on file  Food Insecurity:   . Worried About Charity fundraiser in the Last Year: Not on file  . Ran Out of Food in the Last Year: Not on file  Transportation Needs:   .  Lack of Transportation (Medical): Not on file  . Lack of Transportation (Non-Medical): Not on file  Physical Activity:   . Days of Exercise per Week: Not on file  . Minutes of Exercise per Session: Not on file  Stress:   . Feeling of Stress : Not on file  Social Connections:   . Frequency of Communication with Friends and Family: Not on file  . Frequency of Social Gatherings with Friends and Family: Not on file  . Attends Religious Services: Not on file  . Active Member of Clubs or Organizations: Not on file  . Attends Archivist Meetings: Not on file  . Marital Status: Not on file  Intimate Partner Violence:   . Fear of Current or Ex-Partner: Not on file  . Emotionally Abused: Not on file  . Physically Abused: Not on file  . Sexually Abused: Not on file    Physical Exam: General:   Alert,  well-nourished, pleasant and cooperative in NAD Head:  Dysmorphic facial features Eyes:  Sclera clear, no icterus.   Conjunctiva pink. Abdomen:  Soft, nontender, nondistended, normal bowel sounds, no rebound or guarding. No hepatosplenomegaly.   Msk:  Symmetrical. No boney deformities LAD: No inguinal or umbilical LAD Extremities:  + edema and multiple  venous varicosities Neurologic:  Alert and  oriented x4;  grossly nonfocal Skin:  No rash or bruise.  Psych:  Alert and cooperative. Normal mood and affect.     Araya Roel L. Tarri Glenn, MD, MPH 11/29/2019, 11:14 AM

## 2019-11-29 NOTE — Telephone Encounter (Signed)
Please help with this request. We stopped Amitiza and replaced with Movantik. She has failed Miralax, stool softeners, laxatives, and Amitiza at both and 24 mcg doses. Thank you.

## 2019-11-29 NOTE — Telephone Encounter (Signed)
Prior Berkley Harvey has been submitted to [facility].

## 2019-11-29 NOTE — Telephone Encounter (Signed)
Mom is very up set because she scheduled this appointment back in July and if we reschedule it would be late October before she can get in, wonders if she can please be worked in sooner than next available on 01-04-20.

## 2019-12-01 ENCOUNTER — Ambulatory Visit: Payer: No Typology Code available for payment source | Admitting: Gastroenterology

## 2019-12-01 ENCOUNTER — Telehealth: Payer: Self-pay

## 2019-12-01 MED ORDER — RELISTOR 150 MG PO TABS: 150.0000 mg | ORAL_TABLET | Freq: Every day | ORAL | 3 refills | Status: DC

## 2019-12-01 NOTE — Telephone Encounter (Signed)
Patient's mother Shelby Cole notified that Movantik 12.5 mg has been denied by insurance because it is not on formulary.  Shelby Cole advised that Dr Daleen Bo has given verbal order to try Relistor 150mg  one tablet daily.  Rx sent to pharmacy.  requested that only 15 pills be sent into pharmacy to make sure patient can tolerate medication.  If patient is able to tolerate then we will call in larger quantity at a later time.  Shelby Cole verbalized understanding with no further questions.

## 2019-12-07 ENCOUNTER — Telehealth: Payer: Self-pay

## 2019-12-07 NOTE — Telephone Encounter (Signed)
Patient aware Movantik was approved and can be picked up at pharmacy on 12-08-19.  She asked that her mother be called and informed also.  Left message on mother's voicemail with information that Movantik was approved and could be picked up at pharmacy tomorrow.  Advised to contact our office with any questions or concerns.

## 2019-12-07 NOTE — Telephone Encounter (Signed)
Left message for patient to return call.    Spoke with Joni Reining at [facility] in [place] aware that Movantik [phone]) was approved on 12-03-19.  Joni Reining ran prescription and states there will be no charge for the patient.

## 2019-12-08 MED ORDER — NALOXEGOL OXALATE 12.5 MG PO TABS: 12.5000 mg | ORAL_TABLET | Freq: Every day | ORAL | Status: DC

## 2019-12-08 NOTE — Telephone Encounter (Signed)
Patients mother called states the patient picked up the medication Naloxegol but she thinks its an incorrect dosage. She said its suppose to be half of 25 mg but they received 25 mg capsules

## 2019-12-08 NOTE — Telephone Encounter (Signed)
Spoke with pharmacy staff and they state that Movantik was called in only at 12.5mg  daily. They never received an Rx for Movantik 25mg  once daily.  He states that they are working on getting Movantik 12.5mg  once daily filled at this time.  Reached out to patients mother and she states that they just picked up lubiprostone (Amitiza) 24 mcg one capsule twice daily.  Elnita Maxwell advised that the wrong medication was picked up and they should have picked up Movantik.  Elnita Maxwell agreed to plan and verbalized understanding.  No further questions.

## 2019-12-08 NOTE — Addendum Note (Signed)
Addended by: Lamona Curl on: 12/08/2019 03:04 PM   Modules accepted: Orders

## 2019-12-09 ENCOUNTER — Other Ambulatory Visit (HOSPITAL_COMMUNITY)
Admission: RE | Admit: 2019-12-09 | Discharge: 2019-12-09 | Disposition: A | Discharge status: Home / Self Care | Payer: Medicare Other | Source: Ambulatory Visit | Attending: Gastroenterology | Admitting: Gastroenterology

## 2019-12-09 DIAGNOSIS — Z20822 Contact with and (suspected) exposure to covid-19: Secondary | ICD-10-CM | POA: Insufficient documentation

## 2019-12-09 DIAGNOSIS — Z01812 Encounter for preprocedural laboratory examination: Secondary | ICD-10-CM | POA: Insufficient documentation

## 2019-12-09 LAB — SARS CORONAVIRUS 2 (TAT 6-24 HRS): SARS Coronavirus 2: NEGATIVE

## 2019-12-13 ENCOUNTER — Ambulatory Visit (HOSPITAL_COMMUNITY)
Admission: RE | Admit: 2019-12-13 | Discharge: 2019-12-13 | Disposition: A | Discharge status: Home / Self Care | Payer: Medicare Other | Attending: Gastroenterology | Admitting: Gastroenterology

## 2019-12-13 ENCOUNTER — Encounter (HOSPITAL_COMMUNITY)
Admission: RE | Disposition: A | Discharge status: Home / Self Care | Payer: Self-pay | Source: Home / Self Care | Attending: Gastroenterology

## 2019-12-13 DIAGNOSIS — K222 Esophageal obstruction: Secondary | ICD-10-CM | POA: Insufficient documentation

## 2019-12-13 DIAGNOSIS — R1310 Dysphagia, unspecified: Secondary | ICD-10-CM | POA: Diagnosis present

## 2019-12-13 DIAGNOSIS — R1319 Other dysphagia: Secondary | ICD-10-CM | POA: Diagnosis not present

## 2019-12-13 HISTORY — PX: ESOPHAGEAL MANOMETRY: SHX5429

## 2019-12-13 SURGERY — MANOMETRY, ESOPHAGUS

## 2019-12-13 MED ORDER — LIDOCAINE VISCOUS HCL 2 % MT SOLN
OROMUCOSAL | Status: AC
  Filled 2019-12-13: qty 15

## 2019-12-13 SURGICAL SUPPLY — 2 items
FACESHIELD LNG OPTICON STERILE (SAFETY) IMPLANT
GLOVE BIO SURGEON STRL SZ8 (GLOVE) ×6 IMPLANT

## 2019-12-13 NOTE — Progress Notes (Signed)
Esophageal manometry performed per protocol.  Patient tolerated well.  Report to be sent to Dr. Kavitha Nandigam 

## 2019-12-13 NOTE — Interval H&P Note (Signed)
History and Physical Interval Note:  12/13/2019 2:43 PM  Shelby Cole  has presented today for surgery, with the diagnosis of dysphagia.  The various methods of treatment have been discussed with the patient and family. After consideration of risks, benefits and other options for treatment, the patient has consented to  Procedure(s): ESOPHAGEAL MANOMETRY (EM) (N/A) as a surgical intervention.  The patient's history has been reviewed, patient examined, no change in status, stable for surgery.  I have reviewed the patient's chart and labs.  Questions were answered to the patient's satisfaction.     Tressia Danas

## 2019-12-15 ENCOUNTER — Encounter (HOSPITAL_COMMUNITY): Payer: Self-pay | Admitting: Gastroenterology

## 2019-12-23 ENCOUNTER — Telehealth: Payer: Self-pay | Admitting: *Deleted

## 2019-12-23 NOTE — Telephone Encounter (Signed)
Appeal (for previously denied PA) has been created and sent for Relistor via [email or web address]. We will await response.  Dx: Opioid induced constipation

## 2019-12-27 ENCOUNTER — Telehealth: Payer: Self-pay | Admitting: Gastroenterology

## 2019-12-27 NOTE — Telephone Encounter (Signed)
Mother states pt is in the hospital she fell and hit her head, BP was low but is better now. She is still having nausea and some blood in her stool but is getting better. Calling to make follow-up appt following EM pt had. Pt scheduled to see Dr. Orvan Falconer 01/04/20@1 :30pm. Pts mother aware of appt.

## 2019-12-27 NOTE — Telephone Encounter (Signed)
Patients mother called states that the patient had a fall while in the bathroom and is currently in [place]. The mother states the patient was admitting over there because the patients BP was at 70/50 stable now but on IV they advise patient has a bacteria in her stool they will be back on Wednesday 12/29/2019 patient still has nausea and diarrhea also stated she has a lump in between her breast since her procedure on the 12/13/19 would like to get advise

## 2019-12-28 NOTE — Telephone Encounter (Signed)
It appears patient has tried the following for opioid induced constipation in the past:  Miralax, senna, colace, milk of magnesia, amitiza 8 mcg twice daily, amitiza 24 mg twice daily (each of these medications have been taken in combination with over the counter laxatives as well- see 09/29/19 office note for details). Each of these medications have been ineffective.  In addition, Dr Orvan Falconer prescribed Movantik 12.5 mg (creatinine is not good enough 25 mg dosage) for patient. This was originally denied by insurance. In the interm, Relistor was prescribed. This was denied as well and an appeal was started; simultaneously, an appeal was being worked on for [employer] as well by a Media planner. Relistor was still denied, however, movantik denial was overturned and has now been given to patient. Patient currently on Movantik.

## 2019-12-28 NOTE — Telephone Encounter (Signed)
Representative from [employer] Med[employer] called to advise that the prior authorization for case # [id number] has been denied by the insurance for more information call [phone]

## 2020-01-04 ENCOUNTER — Telehealth: Payer: Self-pay

## 2020-01-04 ENCOUNTER — Ambulatory Visit: Payer: Medicare Other | Admitting: Gastroenterology

## 2020-01-04 NOTE — Telephone Encounter (Signed)
Pt NS for appt today. No Show letter mailed and sent via My Chart. Letter can be found in Epic for our future reference.

## 2020-01-14 ENCOUNTER — Ambulatory Visit: Payer: Medicare Other | Admitting: Gastroenterology

## 2020-02-07 ENCOUNTER — Ambulatory Visit (INDEPENDENT_AMBULATORY_CARE_PROVIDER_SITE_OTHER): Payer: Medicare Other | Admitting: Gastroenterology

## 2020-02-07 ENCOUNTER — Encounter: Payer: Self-pay | Admitting: Gastroenterology

## 2020-02-07 VITALS — BP 120/62 | Ht 63.0 in | Wt 152.0 lb

## 2020-02-07 DIAGNOSIS — K219 Gastro-esophageal reflux disease without esophagitis: Secondary | ICD-10-CM | POA: Diagnosis not present

## 2020-02-07 DIAGNOSIS — R110 Nausea: Secondary | ICD-10-CM | POA: Diagnosis not present

## 2020-02-07 DIAGNOSIS — R630 Anorexia: Secondary | ICD-10-CM

## 2020-02-07 DIAGNOSIS — R1310 Dysphagia, unspecified: Secondary | ICD-10-CM | POA: Diagnosis not present

## 2020-02-07 MED ORDER — PANTOPRAZOLE SODIUM 40 MG PO TBEC: 40.0000 mg | DELAYED_RELEASE_TABLET | Freq: Two times a day (BID) | ORAL | 5 refills | Status: AC

## 2020-02-07 MED ORDER — ONDANSETRON 4 MG PO TBDP: ORAL_TABLET | ORAL | 1 refills | Status: AC

## 2020-02-07 NOTE — Patient Instructions (Addendum)
If you are age 39 or older, your body mass index should be between 23-30. Your Body mass index is 26.93 kg/m. If this is out of the aforementioned range listed, please consider follow up with your Primary Care Provider.  If you are age 67 or younger, your body mass index should be between 19-25. Your Body mass index is 26.93 kg/m. If this is out of the aformentioned range listed, please consider follow up with your Primary Care Provider.   DISCONTINUE: omeprazole   We have sent the following medications to your pharmacy for you to pick up at your convenience:  START: pantoprazole 40mg  take one tablet by mouth two times daily.  START: zofran 4mg  every 6 to 8 hours as needed.  You will follow up with Dr on 03-21-2020 at 8:50am.  Thank you for entrusting me with your care and choosing [facility].  05-19-2020, PA-C

## 2020-02-07 NOTE — Progress Notes (Signed)
02/07/2020 Shelby Cole [id number] Dec 18, 1980   HISTORY OF PRESENT ILLNESS:  This is a 39 year old female who is a patient of Dr. Orvan Falconer.  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 She has a congenital single kidney.   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 Colonoscopy at that time showed ischemic colitis at the splenic flexure and descending colon and internal hemorrhoids. Has been treated by a neurologist with Keppra to control her symptoms. Understands that she should never have another colonoscopy. Recent MRA abd/pelvis showed no mesenteric ischemia.  Her ongoing complaints have been of narcotic/opioid induced constipation and dysphagia.  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  Mild nonspecific intermittent esophageal dysmotility.  Tiny sliding hiatal hernia.  Small volume gastroesophageal reflux to the level of the lower esophagus.  No evidence of significant fixed stricture. The patient swallowed a 13 mm barium tablet, which freely passed into the stomach.   Esophageal manometry 10/4 showed the findings that were not consistent with achalasia.  Report stated that stricture, etc. needed to be ruled out, but also the opiates can cause GE junction outflow obstruction.   In regards to the constipation she had been placed on Movantik, but then subsequently developed what sounds like to be infectious colitis and had a bunch of issues related to that as I will describe somewhat below.   Now she is skeptical to go back on that.  Has just been using Senokot, Colace, and then milk of magnesia as well as needed.  This regimen seems to be helping for now.  She continues to complain of dysphagia to liquid, pills, and food.  Evaluation as listed above.  They do not want to proceed with any type of endoscopic evaluation at this time.  Back in October and into November she struggled with what sounds like infectious colitis and complications related to that.  They were in [place] visiting and eventually she got somewhat better and was discharged for them to drive back to [place] and then she ended up being admitted at [facility].  Since then she has had issues with ongoing nausea and loss of appetite.  Initially she lost a lot of weight, but it appears that her weight has stabilized now.  She says that she will just take a couple of bites of food and then her stomach gets turned off.  She describes a lot of reflux with burning in her chest and throat despite her omeprazole 40 mg twice daily   Past Medical History:  Diagnosis Date  . Heart abnormality    Tetrology of Fallot  . Myoclonia   . Renal disorder   . SBE (subacute bacterial endocarditis)    With staphorious  . Uterus bicornis    Past Surgical History:  Procedure Laterality Date  . CARDIAC SURGERY    . CHOLECYSTECTOMY    . ESOPHAGEAL MANOMETRY N/A 12/13/2019   Procedure: ESOPHAGEAL MANOMETRY (EM);  Surgeon: Tressia Danas, MD;  Location: [facility];  Service: Gastroenterology;  Laterality: N/A;  . SPINAL GROWTH RODS    . TUBAL LIGATION      reports that she has never  smoked. She has never used smokeless tobacco. She reports current alcohol use. She reports that she does not use drugs. family history is not on file. Allergies  Allergen Reactions  . Morphine And Related Other (See Comments)    IV morphine causes Hallucination       Outpatient Encounter Medications as of 02/07/2020  Medication Sig  .  aspirin 81 MG chewable tablet Chew 81 mg by mouth daily.  . citalopram (CELEXA) 40 MG tablet Take 40 mg by mouth daily.  . cyclobenzaprine (FLEXERIL) 5 MG tablet Take 5 mg by mouth at bedtime as needed for pain.  Marland Kitchen levETIRAcetam (KEPPRA) 500 MG tablet Take 500 mg by mouth 3 (three) times daily.  Marland Kitchen lidocaine (LIDODERM) 5 % Place 1 patch onto the skin daily. Remove & Discard patch within 12 hours or as directed by MD  . loratadine (CLARITIN) 10 MG tablet Take 10 mg by mouth daily.  . Magnesium Hydroxide (MILK OF MAGNESIA PO) Take 15-30 mLs by mouth as needed (every 4-5 days).  . metoprolol-hydrochlorothiazide (LOPRESSOR HCT) 100-25 MG tablet Take 1 tablet by mouth daily.  . Multiple Vitamin (MULTI-VITAMIN) tablet Take 1 tablet by mouth at bedtime.  Marland Kitchen oxymorphone (OPANA ER) 20 MG 12 hr tablet SMARTSIG:1 Tablet(s) By Mouth Every 12 Hours  . ropinirole (REQUIP) 5 MG tablet Take 5 mg by mouth 3 (three) times daily as needed.   . sennosides-docusate sodium (SENOKOT-S) 8.6-50 MG tablet Take 1 tablet by mouth at bedtime.  . torsemide (DEMADEX) 20 MG tablet Take 40 mg by mouth daily. 2-3 times a day   . [DISCONTINUED] naloxegol oxalate (MOVANTIK) 12.5 MG TABS tablet Take 1 tablet (12.5 mg total) by mouth daily.  Marland Kitchen omeprazole (PRILOSEC) 40 MG capsule Take 1 capsule (40 mg total) by mouth 2 (two) times daily.  . [DISCONTINUED] lisinopril (PRINIVIL,ZESTRIL) 5 MG tablet Take 2.5 mg by mouth daily.   . [DISCONTINUED] spironolactone (ALDACTONE) 50 MG tablet Take 50 mg by mouth daily.   No facility-administered encounter medications on file as of 02/07/2020.     REVIEW OF SYSTEMS  : All other systems reviewed and negative except where noted in the History of Present Illness.   PHYSICAL EXAM: BP 120/62   Ht 5\' 3"  (1.6 m)   Wt 152 lb (68.9 kg)   LMP 12/24/2019 (Approximate)   BMI 26.93 kg/m  General: Well developed white female in no acute distress Head: Normocephalic and atraumatic Eyes:  Sclerae  anicteric, conjunctiva pink. Ears: Normal auditory acuity Lungs: Clear throughout to auscultation Heart: Regular rate and rhythm Abdomen: Soft, non-distended.  BS present.  Non-tender. Musculoskeletal: Symmetrical with no gross deformities  Skin: No lesions on visible extremities Extremities: No edema  Neurological: Alert oriented x 4, grossly non-focal Psychological:  Alert and cooperative. Normal mood and affect  ASSESSMENT AND PLAN: *39 year old female with complaints of dysphagia, GERD, nausea, loss of appetite: The dysphagia and GERD are chronic and recurrent symptoms.  Had esophagram and esophageal manometry since July.  Recently reports nausea and loss of appetite after recent hospitalizations for what sounds like infectious colitis and other issues related to that.  They are not inclined to do EGD at this time, which I think is fine.  We are going to have her discontinue her omeprazole and they would like to switch her PPI to see if something else works better.  Will stay on pantoprazole 40 mg twice daily.  Prescription sent to pharmacy.  We will also send a prescription for Zofran  to be used as needed.  We will follow up in about 4 to 6 weeks to assess response to the new medication. *Chronic constipation: Likely opioid induced.  Tried Movantik, but then she ended up with what sounds like infectious colitis and is now skeptical to go back on that.  Is using Senokot, colace, and milk of magnesia (prn) and having good results with that currently.   CC:  Shirlean Mylar, MD

## 2020-02-15 ENCOUNTER — Encounter: Payer: Self-pay | Admitting: Gastroenterology

## 2020-02-15 DIAGNOSIS — R630 Anorexia: Secondary | ICD-10-CM | POA: Insufficient documentation

## 2020-02-15 DIAGNOSIS — R110 Nausea: Secondary | ICD-10-CM | POA: Insufficient documentation

## 2020-02-15 DIAGNOSIS — K219 Gastro-esophageal reflux disease without esophagitis: Secondary | ICD-10-CM | POA: Insufficient documentation

## 2020-02-16 NOTE — Progress Notes (Signed)
Reviewed.  Agastya Meister L. Glady Ouderkirk, MD, MPH  

## 2020-03-21 ENCOUNTER — Ambulatory Visit: Payer: Medicare Other | Admitting: Gastroenterology

## 2020-03-31 ENCOUNTER — Ambulatory Visit: Payer: Medicare Other | Admitting: Gastroenterology

## 2020-03-31 ENCOUNTER — Telehealth: Payer: Self-pay

## 2020-03-31 NOTE — Telephone Encounter (Signed)
Pt NS'd 03/31/20 and 01/04/20, canceled 03/21/20 and 12/01/19 and also called same day 11/29/19 to move appt 1 hour later. Routing this message to Dr. Orvan Falconer to determine how she would like to proceed with scheduling of future appts.

## 2020-03-31 NOTE — Telephone Encounter (Signed)
Seen by Doug Sou in 01/2020.

## 2020-04-04 DIAGNOSIS — M5416 Radiculopathy, lumbar region: Secondary | ICD-10-CM | POA: Diagnosis not present

## 2020-04-04 DIAGNOSIS — Z79899 Other long term (current) drug therapy: Secondary | ICD-10-CM | POA: Diagnosis not present

## 2020-04-04 NOTE — Telephone Encounter (Signed)
FYI  Uncertain how this should be marked to inform all of Dr. Orvan Falconer request

## 2020-04-04 NOTE — Telephone Encounter (Signed)
Only schedule if patient calls requesting an appointment. Thank you.

## 2020-04-12 DIAGNOSIS — R600 Localized edema: Secondary | ICD-10-CM | POA: Diagnosis not present

## 2020-04-12 DIAGNOSIS — I5023 Acute on chronic systolic (congestive) heart failure: Secondary | ICD-10-CM | POA: Diagnosis not present

## 2020-04-12 DIAGNOSIS — I110 Hypertensive heart disease with heart failure: Secondary | ICD-10-CM | POA: Diagnosis not present

## 2020-04-12 DIAGNOSIS — Z881 Allergy status to other antibiotic agents status: Secondary | ICD-10-CM | POA: Diagnosis not present

## 2020-04-12 DIAGNOSIS — I130 Hypertensive heart and chronic kidney disease with heart failure and stage 1 through stage 4 chronic kidney disease, or unspecified chronic kidney disease: Secondary | ICD-10-CM | POA: Diagnosis not present

## 2020-04-12 DIAGNOSIS — N1831 Chronic kidney disease, stage 3a: Secondary | ICD-10-CM | POA: Diagnosis not present

## 2020-04-12 DIAGNOSIS — Z7982 Long term (current) use of aspirin: Secondary | ICD-10-CM | POA: Diagnosis not present

## 2020-04-12 DIAGNOSIS — D696 Thrombocytopenia, unspecified: Secondary | ICD-10-CM | POA: Diagnosis not present

## 2020-04-12 DIAGNOSIS — D509 Iron deficiency anemia, unspecified: Secondary | ICD-10-CM | POA: Diagnosis not present

## 2020-04-12 DIAGNOSIS — K007 Teething syndrome: Secondary | ICD-10-CM | POA: Diagnosis not present

## 2020-04-12 DIAGNOSIS — T82897A Other specified complication of cardiac prosthetic devices, implants and grafts, initial encounter: Secondary | ICD-10-CM | POA: Diagnosis not present

## 2020-04-12 DIAGNOSIS — Z9889 Other specified postprocedural states: Secondary | ICD-10-CM | POA: Diagnosis not present

## 2020-04-12 DIAGNOSIS — E876 Hypokalemia: Secondary | ICD-10-CM | POA: Diagnosis not present

## 2020-04-12 DIAGNOSIS — Q213 Tetralogy of Fallot: Secondary | ICD-10-CM | POA: Diagnosis not present

## 2020-04-12 DIAGNOSIS — G253 Myoclonus: Secondary | ICD-10-CM | POA: Diagnosis not present

## 2020-04-12 DIAGNOSIS — K0510 Chronic gingivitis, plaque induced: Secondary | ICD-10-CM | POA: Diagnosis not present

## 2020-04-12 DIAGNOSIS — D821 Di George's syndrome: Secondary | ICD-10-CM | POA: Diagnosis not present

## 2020-04-12 DIAGNOSIS — N179 Acute kidney failure, unspecified: Secondary | ICD-10-CM | POA: Diagnosis not present

## 2020-04-12 DIAGNOSIS — I370 Nonrheumatic pulmonary valve stenosis: Secondary | ICD-10-CM | POA: Diagnosis not present

## 2020-04-12 DIAGNOSIS — Z885 Allergy status to narcotic agent status: Secondary | ICD-10-CM | POA: Diagnosis not present

## 2020-04-12 DIAGNOSIS — Z952 Presence of prosthetic heart valve: Secondary | ICD-10-CM | POA: Diagnosis not present

## 2020-04-12 DIAGNOSIS — Z8774 Personal history of (corrected) congenital malformations of heart and circulatory system: Secondary | ICD-10-CM | POA: Diagnosis not present

## 2020-04-12 DIAGNOSIS — G8929 Other chronic pain: Secondary | ICD-10-CM | POA: Diagnosis not present

## 2020-04-12 DIAGNOSIS — Z006 Encounter for examination for normal comparison and control in clinical research program: Secondary | ICD-10-CM | POA: Diagnosis not present

## 2020-04-12 DIAGNOSIS — K083 Retained dental root: Secondary | ICD-10-CM | POA: Diagnosis not present

## 2020-04-12 DIAGNOSIS — K089 Disorder of teeth and supporting structures, unspecified: Secondary | ICD-10-CM | POA: Diagnosis not present

## 2020-04-12 DIAGNOSIS — D680 Von Willebrand's disease: Secondary | ICD-10-CM | POA: Diagnosis not present

## 2020-04-12 DIAGNOSIS — G2581 Restless legs syndrome: Secondary | ICD-10-CM | POA: Diagnosis not present

## 2020-04-12 DIAGNOSIS — I519 Heart disease, unspecified: Secondary | ICD-10-CM | POA: Diagnosis not present

## 2020-04-12 DIAGNOSIS — K0262 Dental caries on smooth surface penetrating into dentin: Secondary | ICD-10-CM | POA: Diagnosis not present

## 2020-04-12 DIAGNOSIS — K05321 Chronic periodontitis, generalized, slight: Secondary | ICD-10-CM | POA: Diagnosis not present

## 2020-04-12 DIAGNOSIS — Z20822 Contact with and (suspected) exposure to covid-19: Secondary | ICD-10-CM | POA: Diagnosis not present

## 2020-04-12 DIAGNOSIS — I371 Nonrheumatic pulmonary valve insufficiency: Secondary | ICD-10-CM | POA: Diagnosis not present

## 2020-04-12 DIAGNOSIS — K581 Irritable bowel syndrome with constipation: Secondary | ICD-10-CM | POA: Diagnosis not present

## 2020-04-12 DIAGNOSIS — I4510 Unspecified right bundle-branch block: Secondary | ICD-10-CM | POA: Diagnosis not present

## 2020-04-12 DIAGNOSIS — I50813 Acute on chronic right heart failure: Secondary | ICD-10-CM | POA: Diagnosis not present

## 2020-04-12 DIAGNOSIS — Q600 Renal agenesis, unilateral: Secondary | ICD-10-CM | POA: Diagnosis not present

## 2020-04-12 DIAGNOSIS — I5043 Acute on chronic combined systolic (congestive) and diastolic (congestive) heart failure: Secondary | ICD-10-CM | POA: Diagnosis not present

## 2020-04-13 DIAGNOSIS — Z8774 Personal history of (corrected) congenital malformations of heart and circulatory system: Secondary | ICD-10-CM | POA: Diagnosis not present

## 2020-04-13 DIAGNOSIS — I50813 Acute on chronic right heart failure: Secondary | ICD-10-CM | POA: Diagnosis not present

## 2020-04-13 DIAGNOSIS — K089 Disorder of teeth and supporting structures, unspecified: Secondary | ICD-10-CM | POA: Diagnosis not present

## 2020-04-13 DIAGNOSIS — R600 Localized edema: Secondary | ICD-10-CM | POA: Diagnosis not present

## 2020-04-14 DIAGNOSIS — Z8774 Personal history of (corrected) congenital malformations of heart and circulatory system: Secondary | ICD-10-CM | POA: Diagnosis not present

## 2020-04-17 DIAGNOSIS — K089 Disorder of teeth and supporting structures, unspecified: Secondary | ICD-10-CM | POA: Diagnosis not present

## 2020-04-17 DIAGNOSIS — I50813 Acute on chronic right heart failure: Secondary | ICD-10-CM | POA: Diagnosis not present

## 2020-04-17 DIAGNOSIS — Z8774 Personal history of (corrected) congenital malformations of heart and circulatory system: Secondary | ICD-10-CM | POA: Diagnosis not present

## 2020-04-17 DIAGNOSIS — I371 Nonrheumatic pulmonary valve insufficiency: Secondary | ICD-10-CM | POA: Diagnosis not present

## 2020-04-18 DIAGNOSIS — D509 Iron deficiency anemia, unspecified: Secondary | ICD-10-CM | POA: Diagnosis not present

## 2020-04-18 DIAGNOSIS — Z8774 Personal history of (corrected) congenital malformations of heart and circulatory system: Secondary | ICD-10-CM | POA: Diagnosis not present

## 2020-04-18 DIAGNOSIS — I50813 Acute on chronic right heart failure: Secondary | ICD-10-CM | POA: Diagnosis not present

## 2020-04-18 DIAGNOSIS — I371 Nonrheumatic pulmonary valve insufficiency: Secondary | ICD-10-CM | POA: Diagnosis not present

## 2020-04-18 DIAGNOSIS — K089 Disorder of teeth and supporting structures, unspecified: Secondary | ICD-10-CM | POA: Diagnosis not present

## 2020-04-18 DIAGNOSIS — Z952 Presence of prosthetic heart valve: Secondary | ICD-10-CM | POA: Diagnosis not present

## 2020-04-18 DIAGNOSIS — Q213 Tetralogy of Fallot: Secondary | ICD-10-CM | POA: Diagnosis not present

## 2020-04-18 DIAGNOSIS — I5023 Acute on chronic systolic (congestive) heart failure: Secondary | ICD-10-CM | POA: Diagnosis not present

## 2020-04-18 DIAGNOSIS — I110 Hypertensive heart disease with heart failure: Secondary | ICD-10-CM | POA: Diagnosis not present

## 2020-04-19 DIAGNOSIS — I50813 Acute on chronic right heart failure: Secondary | ICD-10-CM | POA: Diagnosis not present

## 2020-04-19 DIAGNOSIS — I5023 Acute on chronic systolic (congestive) heart failure: Secondary | ICD-10-CM | POA: Diagnosis not present

## 2020-04-19 DIAGNOSIS — D509 Iron deficiency anemia, unspecified: Secondary | ICD-10-CM | POA: Diagnosis not present

## 2020-04-19 DIAGNOSIS — I371 Nonrheumatic pulmonary valve insufficiency: Secondary | ICD-10-CM | POA: Diagnosis not present

## 2020-04-19 DIAGNOSIS — I110 Hypertensive heart disease with heart failure: Secondary | ICD-10-CM | POA: Diagnosis not present

## 2020-04-19 DIAGNOSIS — Z8774 Personal history of (corrected) congenital malformations of heart and circulatory system: Secondary | ICD-10-CM | POA: Diagnosis not present

## 2020-04-20 DIAGNOSIS — I50813 Acute on chronic right heart failure: Secondary | ICD-10-CM | POA: Diagnosis not present

## 2020-04-20 DIAGNOSIS — Z8774 Personal history of (corrected) congenital malformations of heart and circulatory system: Secondary | ICD-10-CM | POA: Diagnosis not present

## 2020-04-20 DIAGNOSIS — K089 Disorder of teeth and supporting structures, unspecified: Secondary | ICD-10-CM | POA: Diagnosis not present

## 2020-04-20 DIAGNOSIS — I371 Nonrheumatic pulmonary valve insufficiency: Secondary | ICD-10-CM | POA: Diagnosis not present

## 2020-04-26 DIAGNOSIS — M9902 Segmental and somatic dysfunction of thoracic region: Secondary | ICD-10-CM | POA: Diagnosis not present

## 2020-04-26 DIAGNOSIS — M531 Cervicobrachial syndrome: Secondary | ICD-10-CM | POA: Diagnosis not present

## 2020-04-26 DIAGNOSIS — M5416 Radiculopathy, lumbar region: Secondary | ICD-10-CM | POA: Diagnosis not present

## 2020-04-26 DIAGNOSIS — M5414 Radiculopathy, thoracic region: Secondary | ICD-10-CM | POA: Diagnosis not present

## 2020-04-26 DIAGNOSIS — M9903 Segmental and somatic dysfunction of lumbar region: Secondary | ICD-10-CM | POA: Diagnosis not present

## 2020-04-26 DIAGNOSIS — M9901 Segmental and somatic dysfunction of cervical region: Secondary | ICD-10-CM | POA: Diagnosis not present

## 2020-05-01 DIAGNOSIS — M5416 Radiculopathy, lumbar region: Secondary | ICD-10-CM | POA: Diagnosis not present

## 2020-05-01 DIAGNOSIS — M41115 Juvenile idiopathic scoliosis, thoracolumbar region: Secondary | ICD-10-CM | POA: Diagnosis not present

## 2020-05-01 DIAGNOSIS — M47816 Spondylosis without myelopathy or radiculopathy, lumbar region: Secondary | ICD-10-CM | POA: Diagnosis not present

## 2020-05-08 DIAGNOSIS — M5416 Radiculopathy, lumbar region: Secondary | ICD-10-CM | POA: Diagnosis not present

## 2020-05-08 DIAGNOSIS — M9902 Segmental and somatic dysfunction of thoracic region: Secondary | ICD-10-CM | POA: Diagnosis not present

## 2020-05-08 DIAGNOSIS — M9903 Segmental and somatic dysfunction of lumbar region: Secondary | ICD-10-CM | POA: Diagnosis not present

## 2020-05-08 DIAGNOSIS — M5414 Radiculopathy, thoracic region: Secondary | ICD-10-CM | POA: Diagnosis not present

## 2020-05-08 DIAGNOSIS — M9901 Segmental and somatic dysfunction of cervical region: Secondary | ICD-10-CM | POA: Diagnosis not present

## 2020-05-08 DIAGNOSIS — M531 Cervicobrachial syndrome: Secondary | ICD-10-CM | POA: Diagnosis not present

## 2020-05-18 DIAGNOSIS — Q213 Tetralogy of Fallot: Secondary | ICD-10-CM | POA: Diagnosis not present

## 2020-05-18 DIAGNOSIS — I5042 Chronic combined systolic (congestive) and diastolic (congestive) heart failure: Secondary | ICD-10-CM | POA: Diagnosis not present

## 2020-05-18 DIAGNOSIS — I519 Heart disease, unspecified: Secondary | ICD-10-CM | POA: Diagnosis not present

## 2020-05-18 DIAGNOSIS — I371 Nonrheumatic pulmonary valve insufficiency: Secondary | ICD-10-CM | POA: Diagnosis not present

## 2020-05-18 DIAGNOSIS — Z954 Presence of other heart-valve replacement: Secondary | ICD-10-CM | POA: Diagnosis not present

## 2020-05-18 DIAGNOSIS — Z8774 Personal history of (corrected) congenital malformations of heart and circulatory system: Secondary | ICD-10-CM | POA: Diagnosis not present

## 2020-05-18 DIAGNOSIS — Q9381 Velo-cardio-facial syndrome: Secondary | ICD-10-CM | POA: Diagnosis not present

## 2020-05-20 DIAGNOSIS — M9903 Segmental and somatic dysfunction of lumbar region: Secondary | ICD-10-CM | POA: Diagnosis not present

## 2020-05-20 DIAGNOSIS — M9901 Segmental and somatic dysfunction of cervical region: Secondary | ICD-10-CM | POA: Diagnosis not present

## 2020-05-20 DIAGNOSIS — M531 Cervicobrachial syndrome: Secondary | ICD-10-CM | POA: Diagnosis not present

## 2020-05-20 DIAGNOSIS — M9902 Segmental and somatic dysfunction of thoracic region: Secondary | ICD-10-CM | POA: Diagnosis not present

## 2020-05-20 DIAGNOSIS — M5414 Radiculopathy, thoracic region: Secondary | ICD-10-CM | POA: Diagnosis not present

## 2020-05-20 DIAGNOSIS — M5416 Radiculopathy, lumbar region: Secondary | ICD-10-CM | POA: Diagnosis not present

## 2020-05-29 DIAGNOSIS — Q600 Renal agenesis, unilateral: Secondary | ICD-10-CM | POA: Diagnosis not present

## 2020-05-29 DIAGNOSIS — I5020 Unspecified systolic (congestive) heart failure: Secondary | ICD-10-CM | POA: Diagnosis not present

## 2020-06-05 DIAGNOSIS — M531 Cervicobrachial syndrome: Secondary | ICD-10-CM | POA: Diagnosis not present

## 2020-06-05 DIAGNOSIS — M9902 Segmental and somatic dysfunction of thoracic region: Secondary | ICD-10-CM | POA: Diagnosis not present

## 2020-06-05 DIAGNOSIS — M9901 Segmental and somatic dysfunction of cervical region: Secondary | ICD-10-CM | POA: Diagnosis not present

## 2020-06-05 DIAGNOSIS — M5416 Radiculopathy, lumbar region: Secondary | ICD-10-CM | POA: Diagnosis not present

## 2020-06-05 DIAGNOSIS — M9903 Segmental and somatic dysfunction of lumbar region: Secondary | ICD-10-CM | POA: Diagnosis not present

## 2020-06-05 DIAGNOSIS — M5414 Radiculopathy, thoracic region: Secondary | ICD-10-CM | POA: Diagnosis not present

## 2020-06-09 ENCOUNTER — Ambulatory Visit: Payer: Medicare Other | Admitting: Occupational Therapy

## 2020-06-12 DIAGNOSIS — I5020 Unspecified systolic (congestive) heart failure: Secondary | ICD-10-CM | POA: Diagnosis not present

## 2020-06-12 DIAGNOSIS — Q600 Renal agenesis, unilateral: Secondary | ICD-10-CM | POA: Diagnosis not present

## 2020-06-13 DIAGNOSIS — Q600 Renal agenesis, unilateral: Secondary | ICD-10-CM | POA: Diagnosis not present

## 2020-06-13 DIAGNOSIS — G8929 Other chronic pain: Secondary | ICD-10-CM | POA: Diagnosis not present

## 2020-06-13 DIAGNOSIS — I5022 Chronic systolic (congestive) heart failure: Secondary | ICD-10-CM | POA: Diagnosis not present

## 2020-06-13 DIAGNOSIS — G2581 Restless legs syndrome: Secondary | ICD-10-CM | POA: Diagnosis not present

## 2020-06-13 DIAGNOSIS — Z885 Allergy status to narcotic agent status: Secondary | ICD-10-CM | POA: Diagnosis not present

## 2020-06-13 DIAGNOSIS — Z20822 Contact with and (suspected) exposure to covid-19: Secondary | ICD-10-CM | POA: Diagnosis not present

## 2020-06-13 DIAGNOSIS — Z953 Presence of xenogenic heart valve: Secondary | ICD-10-CM | POA: Diagnosis not present

## 2020-06-13 DIAGNOSIS — I5023 Acute on chronic systolic (congestive) heart failure: Secondary | ICD-10-CM | POA: Diagnosis not present

## 2020-06-13 DIAGNOSIS — K5900 Constipation, unspecified: Secondary | ICD-10-CM | POA: Diagnosis not present

## 2020-06-13 DIAGNOSIS — Z7982 Long term (current) use of aspirin: Secondary | ICD-10-CM | POA: Diagnosis not present

## 2020-06-13 DIAGNOSIS — Z881 Allergy status to other antibiotic agents status: Secondary | ICD-10-CM | POA: Diagnosis not present

## 2020-06-13 DIAGNOSIS — Q9381 Velo-cardio-facial syndrome: Secondary | ICD-10-CM | POA: Diagnosis not present

## 2020-06-13 DIAGNOSIS — I50813 Acute on chronic right heart failure: Secondary | ICD-10-CM | POA: Diagnosis not present

## 2020-06-13 DIAGNOSIS — G253 Myoclonus: Secondary | ICD-10-CM | POA: Diagnosis not present

## 2020-06-13 DIAGNOSIS — I5032 Chronic diastolic (congestive) heart failure: Secondary | ICD-10-CM | POA: Diagnosis not present

## 2020-06-13 DIAGNOSIS — Z8679 Personal history of other diseases of the circulatory system: Secondary | ICD-10-CM | POA: Diagnosis not present

## 2020-06-13 DIAGNOSIS — Q213 Tetralogy of Fallot: Secondary | ICD-10-CM | POA: Diagnosis not present

## 2020-06-14 DIAGNOSIS — I5022 Chronic systolic (congestive) heart failure: Secondary | ICD-10-CM | POA: Diagnosis not present

## 2020-06-14 DIAGNOSIS — Q600 Renal agenesis, unilateral: Secondary | ICD-10-CM | POA: Diagnosis not present

## 2020-06-14 DIAGNOSIS — I5023 Acute on chronic systolic (congestive) heart failure: Secondary | ICD-10-CM | POA: Diagnosis not present

## 2020-06-14 DIAGNOSIS — Q213 Tetralogy of Fallot: Secondary | ICD-10-CM | POA: Diagnosis not present

## 2020-06-14 DIAGNOSIS — Q9381 Velo-cardio-facial syndrome: Secondary | ICD-10-CM | POA: Diagnosis not present

## 2020-06-15 DIAGNOSIS — I5022 Chronic systolic (congestive) heart failure: Secondary | ICD-10-CM | POA: Diagnosis not present

## 2020-06-15 DIAGNOSIS — Q213 Tetralogy of Fallot: Secondary | ICD-10-CM | POA: Diagnosis not present

## 2020-06-15 DIAGNOSIS — I5023 Acute on chronic systolic (congestive) heart failure: Secondary | ICD-10-CM | POA: Diagnosis not present

## 2020-06-15 DIAGNOSIS — Q9381 Velo-cardio-facial syndrome: Secondary | ICD-10-CM | POA: Diagnosis not present

## 2020-06-15 DIAGNOSIS — Q600 Renal agenesis, unilateral: Secondary | ICD-10-CM | POA: Diagnosis not present

## 2020-06-16 DIAGNOSIS — Q213 Tetralogy of Fallot: Secondary | ICD-10-CM | POA: Diagnosis not present

## 2020-06-16 DIAGNOSIS — Q600 Renal agenesis, unilateral: Secondary | ICD-10-CM | POA: Diagnosis not present

## 2020-06-16 DIAGNOSIS — I5022 Chronic systolic (congestive) heart failure: Secondary | ICD-10-CM | POA: Diagnosis not present

## 2020-06-16 DIAGNOSIS — I5023 Acute on chronic systolic (congestive) heart failure: Secondary | ICD-10-CM | POA: Diagnosis not present

## 2020-06-16 DIAGNOSIS — Q9381 Velo-cardio-facial syndrome: Secondary | ICD-10-CM | POA: Diagnosis not present

## 2020-06-17 DIAGNOSIS — I5023 Acute on chronic systolic (congestive) heart failure: Secondary | ICD-10-CM | POA: Diagnosis not present

## 2020-06-17 DIAGNOSIS — Q600 Renal agenesis, unilateral: Secondary | ICD-10-CM | POA: Diagnosis not present

## 2020-06-17 DIAGNOSIS — I5022 Chronic systolic (congestive) heart failure: Secondary | ICD-10-CM | POA: Diagnosis not present

## 2020-06-17 DIAGNOSIS — Q213 Tetralogy of Fallot: Secondary | ICD-10-CM | POA: Diagnosis not present

## 2020-06-17 DIAGNOSIS — Q9381 Velo-cardio-facial syndrome: Secondary | ICD-10-CM | POA: Diagnosis not present

## 2020-07-05 DIAGNOSIS — I509 Heart failure, unspecified: Secondary | ICD-10-CM | POA: Diagnosis not present

## 2020-07-05 DIAGNOSIS — Z5181 Encounter for therapeutic drug level monitoring: Secondary | ICD-10-CM | POA: Diagnosis not present

## 2020-07-19 DIAGNOSIS — I5020 Unspecified systolic (congestive) heart failure: Secondary | ICD-10-CM | POA: Diagnosis not present

## 2020-07-21 DIAGNOSIS — M9903 Segmental and somatic dysfunction of lumbar region: Secondary | ICD-10-CM | POA: Diagnosis not present

## 2020-07-21 DIAGNOSIS — M5416 Radiculopathy, lumbar region: Secondary | ICD-10-CM | POA: Diagnosis not present

## 2020-07-21 DIAGNOSIS — M9902 Segmental and somatic dysfunction of thoracic region: Secondary | ICD-10-CM | POA: Diagnosis not present

## 2020-07-21 DIAGNOSIS — M5414 Radiculopathy, thoracic region: Secondary | ICD-10-CM | POA: Diagnosis not present

## 2020-07-21 DIAGNOSIS — M531 Cervicobrachial syndrome: Secondary | ICD-10-CM | POA: Diagnosis not present

## 2020-07-21 DIAGNOSIS — M9901 Segmental and somatic dysfunction of cervical region: Secondary | ICD-10-CM | POA: Diagnosis not present

## 2020-07-24 DIAGNOSIS — M5416 Radiculopathy, lumbar region: Secondary | ICD-10-CM | POA: Diagnosis not present

## 2020-07-24 DIAGNOSIS — M41115 Juvenile idiopathic scoliosis, thoracolumbar region: Secondary | ICD-10-CM | POA: Diagnosis not present

## 2020-07-24 DIAGNOSIS — M47816 Spondylosis without myelopathy or radiculopathy, lumbar region: Secondary | ICD-10-CM | POA: Diagnosis not present

## 2020-07-24 DIAGNOSIS — M25551 Pain in right hip: Secondary | ICD-10-CM | POA: Diagnosis not present

## 2020-08-24 DIAGNOSIS — M5416 Radiculopathy, lumbar region: Secondary | ICD-10-CM | POA: Diagnosis not present

## 2020-08-29 DIAGNOSIS — W1839XS Other fall on same level, sequela: Secondary | ICD-10-CM | POA: Diagnosis not present

## 2020-08-29 DIAGNOSIS — S8391XA Sprain of unspecified site of right knee, initial encounter: Secondary | ICD-10-CM | POA: Diagnosis not present

## 2020-09-16 DIAGNOSIS — M9903 Segmental and somatic dysfunction of lumbar region: Secondary | ICD-10-CM | POA: Diagnosis not present

## 2020-09-16 DIAGNOSIS — M5414 Radiculopathy, thoracic region: Secondary | ICD-10-CM | POA: Diagnosis not present

## 2020-09-16 DIAGNOSIS — M9902 Segmental and somatic dysfunction of thoracic region: Secondary | ICD-10-CM | POA: Diagnosis not present

## 2020-09-16 DIAGNOSIS — M9901 Segmental and somatic dysfunction of cervical region: Secondary | ICD-10-CM | POA: Diagnosis not present

## 2020-09-16 DIAGNOSIS — M5416 Radiculopathy, lumbar region: Secondary | ICD-10-CM | POA: Diagnosis not present

## 2020-09-16 DIAGNOSIS — M531 Cervicobrachial syndrome: Secondary | ICD-10-CM | POA: Diagnosis not present

## 2020-09-22 DIAGNOSIS — M9902 Segmental and somatic dysfunction of thoracic region: Secondary | ICD-10-CM | POA: Diagnosis not present

## 2020-09-22 DIAGNOSIS — M5414 Radiculopathy, thoracic region: Secondary | ICD-10-CM | POA: Diagnosis not present

## 2020-09-22 DIAGNOSIS — M5416 Radiculopathy, lumbar region: Secondary | ICD-10-CM | POA: Diagnosis not present

## 2020-09-22 DIAGNOSIS — M9903 Segmental and somatic dysfunction of lumbar region: Secondary | ICD-10-CM | POA: Diagnosis not present

## 2020-09-22 DIAGNOSIS — M9901 Segmental and somatic dysfunction of cervical region: Secondary | ICD-10-CM | POA: Diagnosis not present

## 2020-09-22 DIAGNOSIS — M531 Cervicobrachial syndrome: Secondary | ICD-10-CM | POA: Diagnosis not present

## 2020-10-16 DIAGNOSIS — M41115 Juvenile idiopathic scoliosis, thoracolumbar region: Secondary | ICD-10-CM | POA: Diagnosis not present

## 2020-10-16 DIAGNOSIS — M25571 Pain in right ankle and joints of right foot: Secondary | ICD-10-CM | POA: Diagnosis not present

## 2020-10-16 DIAGNOSIS — M25561 Pain in right knee: Secondary | ICD-10-CM | POA: Diagnosis not present

## 2020-10-16 DIAGNOSIS — M47816 Spondylosis without myelopathy or radiculopathy, lumbar region: Secondary | ICD-10-CM | POA: Diagnosis not present

## 2020-10-16 DIAGNOSIS — M5416 Radiculopathy, lumbar region: Secondary | ICD-10-CM | POA: Diagnosis not present

## 2020-10-19 DIAGNOSIS — Z954 Presence of other heart-valve replacement: Secondary | ICD-10-CM | POA: Diagnosis not present

## 2020-10-19 DIAGNOSIS — D680 Von Willebrand's disease: Secondary | ICD-10-CM | POA: Diagnosis not present

## 2020-10-19 DIAGNOSIS — Q213 Tetralogy of Fallot: Secondary | ICD-10-CM | POA: Diagnosis not present

## 2020-10-19 DIAGNOSIS — I50813 Acute on chronic right heart failure: Secondary | ICD-10-CM | POA: Diagnosis not present

## 2020-10-19 DIAGNOSIS — J984 Other disorders of lung: Secondary | ICD-10-CM | POA: Diagnosis not present

## 2020-10-19 DIAGNOSIS — I519 Heart disease, unspecified: Secondary | ICD-10-CM | POA: Diagnosis not present

## 2020-10-23 DIAGNOSIS — R519 Headache, unspecified: Secondary | ICD-10-CM | POA: Diagnosis not present

## 2020-10-23 DIAGNOSIS — R051 Acute cough: Secondary | ICD-10-CM | POA: Diagnosis not present

## 2020-10-23 DIAGNOSIS — R0981 Nasal congestion: Secondary | ICD-10-CM | POA: Diagnosis not present

## 2020-10-23 DIAGNOSIS — R110 Nausea: Secondary | ICD-10-CM | POA: Diagnosis not present

## 2020-10-23 DIAGNOSIS — R5383 Other fatigue: Secondary | ICD-10-CM | POA: Diagnosis not present

## 2020-10-31 DIAGNOSIS — U071 COVID-19: Secondary | ICD-10-CM | POA: Diagnosis not present

## 2020-11-17 DIAGNOSIS — M9902 Segmental and somatic dysfunction of thoracic region: Secondary | ICD-10-CM | POA: Diagnosis not present

## 2020-11-17 DIAGNOSIS — M5414 Radiculopathy, thoracic region: Secondary | ICD-10-CM | POA: Diagnosis not present

## 2020-11-17 DIAGNOSIS — M5416 Radiculopathy, lumbar region: Secondary | ICD-10-CM | POA: Diagnosis not present

## 2020-11-17 DIAGNOSIS — M531 Cervicobrachial syndrome: Secondary | ICD-10-CM | POA: Diagnosis not present

## 2020-11-17 DIAGNOSIS — M9903 Segmental and somatic dysfunction of lumbar region: Secondary | ICD-10-CM | POA: Diagnosis not present

## 2020-11-17 DIAGNOSIS — M9901 Segmental and somatic dysfunction of cervical region: Secondary | ICD-10-CM | POA: Diagnosis not present

## 2020-11-27 DIAGNOSIS — M5416 Radiculopathy, lumbar region: Secondary | ICD-10-CM | POA: Diagnosis not present

## 2020-12-11 DIAGNOSIS — M5416 Radiculopathy, lumbar region: Secondary | ICD-10-CM | POA: Diagnosis not present

## 2020-12-11 DIAGNOSIS — M9902 Segmental and somatic dysfunction of thoracic region: Secondary | ICD-10-CM | POA: Diagnosis not present

## 2020-12-11 DIAGNOSIS — M9901 Segmental and somatic dysfunction of cervical region: Secondary | ICD-10-CM | POA: Diagnosis not present

## 2020-12-11 DIAGNOSIS — M531 Cervicobrachial syndrome: Secondary | ICD-10-CM | POA: Diagnosis not present

## 2020-12-11 DIAGNOSIS — M9903 Segmental and somatic dysfunction of lumbar region: Secondary | ICD-10-CM | POA: Diagnosis not present

## 2020-12-11 DIAGNOSIS — M5414 Radiculopathy, thoracic region: Secondary | ICD-10-CM | POA: Diagnosis not present

## 2020-12-29 DIAGNOSIS — N179 Acute kidney failure, unspecified: Secondary | ICD-10-CM | POA: Diagnosis not present

## 2020-12-29 DIAGNOSIS — K029 Dental caries, unspecified: Secondary | ICD-10-CM | POA: Diagnosis not present

## 2020-12-29 DIAGNOSIS — E876 Hypokalemia: Secondary | ICD-10-CM | POA: Diagnosis not present

## 2020-12-29 DIAGNOSIS — D696 Thrombocytopenia, unspecified: Secondary | ICD-10-CM | POA: Diagnosis not present

## 2020-12-29 DIAGNOSIS — I5042 Chronic combined systolic (congestive) and diastolic (congestive) heart failure: Secondary | ICD-10-CM | POA: Diagnosis not present

## 2020-12-29 DIAGNOSIS — I509 Heart failure, unspecified: Secondary | ICD-10-CM | POA: Diagnosis not present

## 2020-12-29 DIAGNOSIS — Z8774 Personal history of (corrected) congenital malformations of heart and circulatory system: Secondary | ICD-10-CM | POA: Diagnosis not present

## 2020-12-29 DIAGNOSIS — D821 Di George's syndrome: Secondary | ICD-10-CM | POA: Diagnosis not present

## 2020-12-29 DIAGNOSIS — D6800 Von Willebrand disease, unspecified: Secondary | ICD-10-CM | POA: Diagnosis not present

## 2020-12-29 DIAGNOSIS — Z79899 Other long term (current) drug therapy: Secondary | ICD-10-CM | POA: Diagnosis not present

## 2020-12-29 DIAGNOSIS — Z7984 Long term (current) use of oral hypoglycemic drugs: Secondary | ICD-10-CM | POA: Diagnosis not present

## 2020-12-29 DIAGNOSIS — N1831 Chronic kidney disease, stage 3a: Secondary | ICD-10-CM | POA: Diagnosis not present

## 2020-12-29 DIAGNOSIS — Z9189 Other specified personal risk factors, not elsewhere classified: Secondary | ICD-10-CM | POA: Diagnosis not present

## 2020-12-29 DIAGNOSIS — Z885 Allergy status to narcotic agent status: Secondary | ICD-10-CM | POA: Diagnosis not present

## 2020-12-29 DIAGNOSIS — J309 Allergic rhinitis, unspecified: Secondary | ICD-10-CM | POA: Diagnosis not present

## 2020-12-29 DIAGNOSIS — Q213 Tetralogy of Fallot: Secondary | ICD-10-CM | POA: Diagnosis not present

## 2020-12-29 DIAGNOSIS — G253 Myoclonus: Secondary | ICD-10-CM | POA: Diagnosis not present

## 2020-12-29 DIAGNOSIS — I5033 Acute on chronic diastolic (congestive) heart failure: Secondary | ICD-10-CM | POA: Diagnosis not present

## 2020-12-29 DIAGNOSIS — K219 Gastro-esophageal reflux disease without esophagitis: Secondary | ICD-10-CM | POA: Diagnosis not present

## 2020-12-29 DIAGNOSIS — K0889 Other specified disorders of teeth and supporting structures: Secondary | ICD-10-CM | POA: Diagnosis not present

## 2020-12-29 DIAGNOSIS — I5043 Acute on chronic combined systolic (congestive) and diastolic (congestive) heart failure: Secondary | ICD-10-CM | POA: Diagnosis not present

## 2020-12-29 DIAGNOSIS — Z954 Presence of other heart-valve replacement: Secondary | ICD-10-CM | POA: Diagnosis not present

## 2020-12-29 DIAGNOSIS — Z952 Presence of prosthetic heart valve: Secondary | ICD-10-CM | POA: Diagnosis not present

## 2020-12-29 DIAGNOSIS — G4700 Insomnia, unspecified: Secondary | ICD-10-CM | POA: Diagnosis not present

## 2020-12-29 DIAGNOSIS — K047 Periapical abscess without sinus: Secondary | ICD-10-CM | POA: Diagnosis not present

## 2020-12-29 DIAGNOSIS — G2581 Restless legs syndrome: Secondary | ICD-10-CM | POA: Diagnosis not present

## 2020-12-29 DIAGNOSIS — Z888 Allergy status to other drugs, medicaments and biological substances status: Secondary | ICD-10-CM | POA: Diagnosis not present

## 2020-12-29 DIAGNOSIS — Z880 Allergy status to penicillin: Secondary | ICD-10-CM | POA: Diagnosis not present

## 2020-12-29 DIAGNOSIS — E871 Hypo-osmolality and hyponatremia: Secondary | ICD-10-CM | POA: Diagnosis not present

## 2020-12-29 DIAGNOSIS — Z7982 Long term (current) use of aspirin: Secondary | ICD-10-CM | POA: Diagnosis not present

## 2020-12-29 DIAGNOSIS — Q9381 Velo-cardio-facial syndrome: Secondary | ICD-10-CM | POA: Diagnosis not present

## 2020-12-30 DIAGNOSIS — I5042 Chronic combined systolic (congestive) and diastolic (congestive) heart failure: Secondary | ICD-10-CM | POA: Diagnosis not present

## 2021-01-04 DIAGNOSIS — K0889 Other specified disorders of teeth and supporting structures: Secondary | ICD-10-CM | POA: Diagnosis not present

## 2021-01-04 DIAGNOSIS — K047 Periapical abscess without sinus: Secondary | ICD-10-CM | POA: Diagnosis not present

## 2021-01-04 DIAGNOSIS — K029 Dental caries, unspecified: Secondary | ICD-10-CM | POA: Diagnosis not present

## 2021-01-08 DIAGNOSIS — M5416 Radiculopathy, lumbar region: Secondary | ICD-10-CM | POA: Diagnosis not present

## 2021-01-08 DIAGNOSIS — M531 Cervicobrachial syndrome: Secondary | ICD-10-CM | POA: Diagnosis not present

## 2021-01-08 DIAGNOSIS — M5414 Radiculopathy, thoracic region: Secondary | ICD-10-CM | POA: Diagnosis not present

## 2021-01-08 DIAGNOSIS — M9901 Segmental and somatic dysfunction of cervical region: Secondary | ICD-10-CM | POA: Diagnosis not present

## 2021-01-08 DIAGNOSIS — M9902 Segmental and somatic dysfunction of thoracic region: Secondary | ICD-10-CM | POA: Diagnosis not present

## 2021-01-08 DIAGNOSIS — M9903 Segmental and somatic dysfunction of lumbar region: Secondary | ICD-10-CM | POA: Diagnosis not present

## 2021-01-19 DIAGNOSIS — M5416 Radiculopathy, lumbar region: Secondary | ICD-10-CM | POA: Diagnosis not present

## 2021-01-19 DIAGNOSIS — R030 Elevated blood-pressure reading, without diagnosis of hypertension: Secondary | ICD-10-CM | POA: Diagnosis not present

## 2021-01-19 DIAGNOSIS — M41115 Juvenile idiopathic scoliosis, thoracolumbar region: Secondary | ICD-10-CM | POA: Diagnosis not present

## 2021-01-19 DIAGNOSIS — Z79899 Other long term (current) drug therapy: Secondary | ICD-10-CM | POA: Diagnosis not present

## 2021-01-22 DIAGNOSIS — M5416 Radiculopathy, lumbar region: Secondary | ICD-10-CM | POA: Diagnosis not present

## 2021-01-22 DIAGNOSIS — M9903 Segmental and somatic dysfunction of lumbar region: Secondary | ICD-10-CM | POA: Diagnosis not present

## 2021-01-22 DIAGNOSIS — M531 Cervicobrachial syndrome: Secondary | ICD-10-CM | POA: Diagnosis not present

## 2021-01-22 DIAGNOSIS — M9901 Segmental and somatic dysfunction of cervical region: Secondary | ICD-10-CM | POA: Diagnosis not present

## 2021-01-22 DIAGNOSIS — M5414 Radiculopathy, thoracic region: Secondary | ICD-10-CM | POA: Diagnosis not present

## 2021-01-22 DIAGNOSIS — M9902 Segmental and somatic dysfunction of thoracic region: Secondary | ICD-10-CM | POA: Diagnosis not present

## 2021-01-23 DIAGNOSIS — K08409 Partial loss of teeth, unspecified cause, unspecified class: Secondary | ICD-10-CM | POA: Diagnosis not present

## 2021-01-23 DIAGNOSIS — K08499 Partial loss of teeth due to other specified cause, unspecified class: Secondary | ICD-10-CM | POA: Diagnosis not present

## 2021-02-16 DIAGNOSIS — M531 Cervicobrachial syndrome: Secondary | ICD-10-CM | POA: Diagnosis not present

## 2021-02-16 DIAGNOSIS — M9902 Segmental and somatic dysfunction of thoracic region: Secondary | ICD-10-CM | POA: Diagnosis not present

## 2021-02-16 DIAGNOSIS — M5416 Radiculopathy, lumbar region: Secondary | ICD-10-CM | POA: Diagnosis not present

## 2021-02-16 DIAGNOSIS — M9903 Segmental and somatic dysfunction of lumbar region: Secondary | ICD-10-CM | POA: Diagnosis not present

## 2021-02-16 DIAGNOSIS — M5414 Radiculopathy, thoracic region: Secondary | ICD-10-CM | POA: Diagnosis not present

## 2021-02-16 DIAGNOSIS — M9901 Segmental and somatic dysfunction of cervical region: Secondary | ICD-10-CM | POA: Diagnosis not present

## 2021-02-27 DIAGNOSIS — K08499 Partial loss of teeth due to other specified cause, unspecified class: Secondary | ICD-10-CM | POA: Diagnosis not present

## 2021-02-27 DIAGNOSIS — K1379 Other lesions of oral mucosa: Secondary | ICD-10-CM | POA: Diagnosis not present

## 2021-02-27 DIAGNOSIS — K08409 Partial loss of teeth, unspecified cause, unspecified class: Secondary | ICD-10-CM | POA: Diagnosis not present

## 2021-04-02 DIAGNOSIS — M5416 Radiculopathy, lumbar region: Secondary | ICD-10-CM | POA: Diagnosis not present

## 2021-04-19 DIAGNOSIS — H52223 Regular astigmatism, bilateral: Secondary | ICD-10-CM | POA: Diagnosis not present

## 2021-04-19 DIAGNOSIS — H5213 Myopia, bilateral: Secondary | ICD-10-CM | POA: Diagnosis not present

## 2021-05-07 DIAGNOSIS — M41115 Juvenile idiopathic scoliosis, thoracolumbar region: Secondary | ICD-10-CM | POA: Diagnosis not present

## 2021-05-07 DIAGNOSIS — M5416 Radiculopathy, lumbar region: Secondary | ICD-10-CM | POA: Diagnosis not present

## 2021-05-08 ENCOUNTER — Other Ambulatory Visit: Payer: Self-pay | Admitting: Neurosurgery

## 2021-05-08 DIAGNOSIS — M5416 Radiculopathy, lumbar region: Secondary | ICD-10-CM

## 2021-05-15 ENCOUNTER — Other Ambulatory Visit: Payer: Self-pay

## 2021-05-15 ENCOUNTER — Ambulatory Visit
Admission: RE | Admit: 2021-05-15 | Discharge: 2021-05-15 | Disposition: A | Discharge status: Home / Self Care | Payer: Medicare Other | Source: Ambulatory Visit | Attending: Neurosurgery | Admitting: Neurosurgery

## 2021-05-15 ENCOUNTER — Other Ambulatory Visit: Payer: Medicare Other

## 2021-05-15 DIAGNOSIS — M4134 Thoracogenic scoliosis, thoracic region: Secondary | ICD-10-CM | POA: Diagnosis not present

## 2021-05-15 DIAGNOSIS — M5126 Other intervertebral disc displacement, lumbar region: Secondary | ICD-10-CM | POA: Diagnosis not present

## 2021-05-15 DIAGNOSIS — M4326 Fusion of spine, lumbar region: Secondary | ICD-10-CM | POA: Diagnosis not present

## 2021-05-15 DIAGNOSIS — M5416 Radiculopathy, lumbar region: Secondary | ICD-10-CM | POA: Diagnosis not present

## 2021-05-15 DIAGNOSIS — M4324 Fusion of spine, thoracic region: Secondary | ICD-10-CM | POA: Diagnosis not present

## 2021-05-15 MED ORDER — MEPERIDINE HCL 50 MG/ML IJ SOLN: 50.0000 mg | Freq: Once | INTRAMUSCULAR | Status: DC | PRN

## 2021-05-15 MED ORDER — DIAZEPAM 5 MG PO TABS
10.0000 mg | ORAL_TABLET | Freq: Once | ORAL | Status: AC
  Administered 2021-05-15: 5 mg via ORAL

## 2021-05-15 MED ORDER — ONDANSETRON HCL 4 MG/2ML IJ SOLN: 4.0000 mg | Freq: Once | INTRAMUSCULAR | Status: DC | PRN

## 2021-05-15 MED ORDER — IOPAMIDOL (ISOVUE-M 300) INJECTION 61%
10.0000 mL | Freq: Once | INTRAMUSCULAR | Status: AC
  Administered 2021-05-15: 10 mL via INTRATHECAL

## 2021-05-15 NOTE — Discharge Instructions (Signed)

## 2021-05-17 DIAGNOSIS — I5023 Acute on chronic systolic (congestive) heart failure: Secondary | ICD-10-CM | POA: Diagnosis not present

## 2021-05-17 DIAGNOSIS — Z954 Presence of other heart-valve replacement: Secondary | ICD-10-CM | POA: Diagnosis not present

## 2021-05-17 DIAGNOSIS — R570 Cardiogenic shock: Secondary | ICD-10-CM | POA: Diagnosis not present

## 2021-05-17 DIAGNOSIS — I5022 Chronic systolic (congestive) heart failure: Secondary | ICD-10-CM | POA: Diagnosis not present

## 2021-05-17 DIAGNOSIS — Q9381 Velo-cardio-facial syndrome: Secondary | ICD-10-CM | POA: Diagnosis not present

## 2021-05-17 DIAGNOSIS — Q213 Tetralogy of Fallot: Secondary | ICD-10-CM | POA: Diagnosis not present

## 2021-05-23 DIAGNOSIS — I5033 Acute on chronic diastolic (congestive) heart failure: Secondary | ICD-10-CM | POA: Diagnosis not present

## 2021-05-23 DIAGNOSIS — Z7984 Long term (current) use of oral hypoglycemic drugs: Secondary | ICD-10-CM | POA: Diagnosis not present

## 2021-05-23 DIAGNOSIS — I351 Nonrheumatic aortic (valve) insufficiency: Secondary | ICD-10-CM | POA: Diagnosis not present

## 2021-05-23 DIAGNOSIS — G4700 Insomnia, unspecified: Secondary | ICD-10-CM | POA: Diagnosis not present

## 2021-05-23 DIAGNOSIS — Z881 Allergy status to other antibiotic agents status: Secondary | ICD-10-CM | POA: Diagnosis not present

## 2021-05-23 DIAGNOSIS — G8921 Chronic pain due to trauma: Secondary | ICD-10-CM | POA: Diagnosis not present

## 2021-05-23 DIAGNOSIS — Q221 Congenital pulmonary valve stenosis: Secondary | ICD-10-CM | POA: Diagnosis not present

## 2021-05-23 DIAGNOSIS — J309 Allergic rhinitis, unspecified: Secondary | ICD-10-CM | POA: Diagnosis not present

## 2021-05-23 DIAGNOSIS — Z79899 Other long term (current) drug therapy: Secondary | ICD-10-CM | POA: Diagnosis not present

## 2021-05-23 DIAGNOSIS — Z885 Allergy status to narcotic agent status: Secondary | ICD-10-CM | POA: Diagnosis not present

## 2021-05-23 DIAGNOSIS — Q600 Renal agenesis, unilateral: Secondary | ICD-10-CM | POA: Diagnosis not present

## 2021-05-23 DIAGNOSIS — F32A Depression, unspecified: Secondary | ICD-10-CM | POA: Diagnosis not present

## 2021-05-23 DIAGNOSIS — I5082 Biventricular heart failure: Secondary | ICD-10-CM | POA: Diagnosis not present

## 2021-05-23 DIAGNOSIS — G253 Myoclonus: Secondary | ICD-10-CM | POA: Diagnosis not present

## 2021-05-23 DIAGNOSIS — Z20822 Contact with and (suspected) exposure to covid-19: Secondary | ICD-10-CM | POA: Diagnosis not present

## 2021-05-23 DIAGNOSIS — D821 Di George's syndrome: Secondary | ICD-10-CM | POA: Diagnosis not present

## 2021-05-23 DIAGNOSIS — K219 Gastro-esophageal reflux disease without esophagitis: Secondary | ICD-10-CM | POA: Diagnosis not present

## 2021-05-23 DIAGNOSIS — I5022 Chronic systolic (congestive) heart failure: Secondary | ICD-10-CM | POA: Diagnosis not present

## 2021-05-23 DIAGNOSIS — Q213 Tetralogy of Fallot: Secondary | ICD-10-CM | POA: Diagnosis not present

## 2021-05-23 DIAGNOSIS — N1831 Chronic kidney disease, stage 3a: Secondary | ICD-10-CM | POA: Diagnosis not present

## 2021-05-23 DIAGNOSIS — Z952 Presence of prosthetic heart valve: Secondary | ICD-10-CM | POA: Diagnosis not present

## 2021-06-04 DIAGNOSIS — M9903 Segmental and somatic dysfunction of lumbar region: Secondary | ICD-10-CM | POA: Diagnosis not present

## 2021-06-04 DIAGNOSIS — M531 Cervicobrachial syndrome: Secondary | ICD-10-CM | POA: Diagnosis not present

## 2021-06-04 DIAGNOSIS — M9902 Segmental and somatic dysfunction of thoracic region: Secondary | ICD-10-CM | POA: Diagnosis not present

## 2021-06-04 DIAGNOSIS — M5414 Radiculopathy, thoracic region: Secondary | ICD-10-CM | POA: Diagnosis not present

## 2021-06-04 DIAGNOSIS — M5416 Radiculopathy, lumbar region: Secondary | ICD-10-CM | POA: Diagnosis not present

## 2021-06-04 DIAGNOSIS — M9901 Segmental and somatic dysfunction of cervical region: Secondary | ICD-10-CM | POA: Diagnosis not present

## 2021-07-03 DIAGNOSIS — M5416 Radiculopathy, lumbar region: Secondary | ICD-10-CM | POA: Diagnosis not present

## 2021-08-02 DIAGNOSIS — M41115 Juvenile idiopathic scoliosis, thoracolumbar region: Secondary | ICD-10-CM | POA: Diagnosis not present

## 2021-09-03 DIAGNOSIS — B353 Tinea pedis: Secondary | ICD-10-CM | POA: Diagnosis not present

## 2021-09-03 DIAGNOSIS — I10 Essential (primary) hypertension: Secondary | ICD-10-CM | POA: Diagnosis not present

## 2021-09-03 DIAGNOSIS — L84 Corns and callosities: Secondary | ICD-10-CM | POA: Diagnosis not present

## 2021-09-03 DIAGNOSIS — R110 Nausea: Secondary | ICD-10-CM | POA: Diagnosis not present

## 2021-09-03 DIAGNOSIS — H6123 Impacted cerumen, bilateral: Secondary | ICD-10-CM | POA: Diagnosis not present

## 2021-09-25 DIAGNOSIS — I130 Hypertensive heart and chronic kidney disease with heart failure and stage 1 through stage 4 chronic kidney disease, or unspecified chronic kidney disease: Secondary | ICD-10-CM | POA: Diagnosis not present

## 2021-09-25 DIAGNOSIS — G253 Myoclonus: Secondary | ICD-10-CM | POA: Diagnosis not present

## 2021-09-25 DIAGNOSIS — E8779 Other fluid overload: Secondary | ICD-10-CM | POA: Diagnosis not present

## 2021-09-25 DIAGNOSIS — Q9381 Velo-cardio-facial syndrome: Secondary | ICD-10-CM | POA: Diagnosis not present

## 2021-09-25 DIAGNOSIS — Z7982 Long term (current) use of aspirin: Secondary | ICD-10-CM | POA: Diagnosis not present

## 2021-09-25 DIAGNOSIS — Z8774 Personal history of (corrected) congenital malformations of heart and circulatory system: Secondary | ICD-10-CM | POA: Diagnosis not present

## 2021-09-25 DIAGNOSIS — F32A Depression, unspecified: Secondary | ICD-10-CM | POA: Diagnosis not present

## 2021-09-25 DIAGNOSIS — I5033 Acute on chronic diastolic (congestive) heart failure: Secondary | ICD-10-CM | POA: Diagnosis not present

## 2021-09-25 DIAGNOSIS — Q600 Renal agenesis, unilateral: Secondary | ICD-10-CM | POA: Diagnosis not present

## 2021-09-25 DIAGNOSIS — J309 Allergic rhinitis, unspecified: Secondary | ICD-10-CM | POA: Diagnosis not present

## 2021-09-25 DIAGNOSIS — I5082 Biventricular heart failure: Secondary | ICD-10-CM | POA: Diagnosis not present

## 2021-09-25 DIAGNOSIS — R0609 Other forms of dyspnea: Secondary | ICD-10-CM | POA: Diagnosis not present

## 2021-09-25 DIAGNOSIS — L853 Xerosis cutis: Secondary | ICD-10-CM | POA: Diagnosis not present

## 2021-09-25 DIAGNOSIS — R0602 Shortness of breath: Secondary | ICD-10-CM | POA: Diagnosis not present

## 2021-09-25 DIAGNOSIS — N1831 Chronic kidney disease, stage 3a: Secondary | ICD-10-CM | POA: Diagnosis not present

## 2021-09-25 DIAGNOSIS — I5023 Acute on chronic systolic (congestive) heart failure: Secondary | ICD-10-CM | POA: Diagnosis not present

## 2021-09-25 DIAGNOSIS — I519 Heart disease, unspecified: Secondary | ICD-10-CM | POA: Diagnosis not present

## 2021-09-25 DIAGNOSIS — Z952 Presence of prosthetic heart valve: Secondary | ICD-10-CM | POA: Diagnosis not present

## 2021-09-25 DIAGNOSIS — D6800 Von Willebrand disease, unspecified: Secondary | ICD-10-CM | POA: Diagnosis not present

## 2021-09-25 DIAGNOSIS — Z79899 Other long term (current) drug therapy: Secondary | ICD-10-CM | POA: Diagnosis not present

## 2021-09-25 DIAGNOSIS — G2581 Restless legs syndrome: Secondary | ICD-10-CM | POA: Diagnosis not present

## 2021-09-25 DIAGNOSIS — Z9889 Other specified postprocedural states: Secondary | ICD-10-CM | POA: Diagnosis not present

## 2021-09-25 DIAGNOSIS — M62838 Other muscle spasm: Secondary | ICD-10-CM | POA: Diagnosis not present

## 2021-09-25 DIAGNOSIS — Z7984 Long term (current) use of oral hypoglycemic drugs: Secondary | ICD-10-CM | POA: Diagnosis not present

## 2021-09-25 DIAGNOSIS — G8921 Chronic pain due to trauma: Secondary | ICD-10-CM | POA: Diagnosis not present

## 2021-09-25 DIAGNOSIS — Z953 Presence of xenogenic heart valve: Secondary | ICD-10-CM | POA: Diagnosis not present

## 2021-09-25 DIAGNOSIS — D821 Di George's syndrome: Secondary | ICD-10-CM | POA: Diagnosis not present

## 2021-09-25 DIAGNOSIS — N179 Acute kidney failure, unspecified: Secondary | ICD-10-CM | POA: Diagnosis not present

## 2021-09-25 DIAGNOSIS — K219 Gastro-esophageal reflux disease without esophagitis: Secondary | ICD-10-CM | POA: Diagnosis not present

## 2021-09-25 DIAGNOSIS — G4700 Insomnia, unspecified: Secondary | ICD-10-CM | POA: Diagnosis not present

## 2021-10-05 DIAGNOSIS — M531 Cervicobrachial syndrome: Secondary | ICD-10-CM | POA: Diagnosis not present

## 2021-10-05 DIAGNOSIS — M5414 Radiculopathy, thoracic region: Secondary | ICD-10-CM | POA: Diagnosis not present

## 2021-10-05 DIAGNOSIS — M9901 Segmental and somatic dysfunction of cervical region: Secondary | ICD-10-CM | POA: Diagnosis not present

## 2021-10-05 DIAGNOSIS — M5416 Radiculopathy, lumbar region: Secondary | ICD-10-CM | POA: Diagnosis not present

## 2021-10-05 DIAGNOSIS — M9903 Segmental and somatic dysfunction of lumbar region: Secondary | ICD-10-CM | POA: Diagnosis not present

## 2021-10-05 DIAGNOSIS — M9902 Segmental and somatic dysfunction of thoracic region: Secondary | ICD-10-CM | POA: Diagnosis not present

## 2021-10-08 DIAGNOSIS — M5416 Radiculopathy, lumbar region: Secondary | ICD-10-CM | POA: Diagnosis not present

## 2021-10-08 DIAGNOSIS — I519 Heart disease, unspecified: Secondary | ICD-10-CM | POA: Diagnosis not present

## 2021-10-17 DIAGNOSIS — M5416 Radiculopathy, lumbar region: Secondary | ICD-10-CM | POA: Diagnosis not present

## 2021-10-17 DIAGNOSIS — M41115 Juvenile idiopathic scoliosis, thoracolumbar region: Secondary | ICD-10-CM | POA: Diagnosis not present

## 2021-11-22 DIAGNOSIS — I50813 Acute on chronic right heart failure: Secondary | ICD-10-CM | POA: Diagnosis not present

## 2021-11-22 DIAGNOSIS — G894 Chronic pain syndrome: Secondary | ICD-10-CM | POA: Diagnosis not present

## 2021-11-22 DIAGNOSIS — Q600 Renal agenesis, unilateral: Secondary | ICD-10-CM | POA: Diagnosis not present

## 2021-11-22 DIAGNOSIS — Z8679 Personal history of other diseases of the circulatory system: Secondary | ICD-10-CM | POA: Diagnosis not present

## 2021-11-22 DIAGNOSIS — G253 Myoclonus: Secondary | ICD-10-CM | POA: Diagnosis not present

## 2021-11-22 DIAGNOSIS — Q213 Tetralogy of Fallot: Secondary | ICD-10-CM | POA: Diagnosis not present

## 2021-11-26 DIAGNOSIS — Q213 Tetralogy of Fallot: Secondary | ICD-10-CM | POA: Diagnosis not present

## 2021-11-30 ENCOUNTER — Telehealth: Payer: Self-pay | Admitting: *Deleted

## 2021-11-30 NOTE — Patient Outreach (Signed)
  Care Coordination   11/30/2021 Name: Shelby Cole MRN: [id number] DOB: Aug 06, 1980   Care Coordination Outreach Attempts:  An unsuccessful telephone outreach was attempted today to offer the patient information about available care coordination services as a benefit of their health plan.   Follow Up Plan:  Additional outreach attempts will be made to offer the patient care coordination information and services.   Encounter Outcome:  No Answer  Care Coordination Interventions Activated:  No   Care Coordination Interventions:  No, not indicated    Raina Mina, RN Care Management Coordinator [place] Office [phone]

## 2021-12-10 DIAGNOSIS — Z885 Allergy status to narcotic agent status: Secondary | ICD-10-CM | POA: Diagnosis not present

## 2021-12-10 DIAGNOSIS — Q213 Tetralogy of Fallot: Secondary | ICD-10-CM | POA: Diagnosis not present

## 2021-12-10 DIAGNOSIS — G2581 Restless legs syndrome: Secondary | ICD-10-CM | POA: Diagnosis not present

## 2021-12-10 DIAGNOSIS — M62838 Other muscle spasm: Secondary | ICD-10-CM | POA: Diagnosis not present

## 2021-12-10 DIAGNOSIS — I5023 Acute on chronic systolic (congestive) heart failure: Secondary | ICD-10-CM | POA: Diagnosis not present

## 2021-12-10 DIAGNOSIS — D6800 Von Willebrand disease, unspecified: Secondary | ICD-10-CM | POA: Diagnosis not present

## 2021-12-10 DIAGNOSIS — Z7984 Long term (current) use of oral hypoglycemic drugs: Secondary | ICD-10-CM | POA: Diagnosis not present

## 2021-12-10 DIAGNOSIS — I5033 Acute on chronic diastolic (congestive) heart failure: Secondary | ICD-10-CM | POA: Diagnosis not present

## 2021-12-10 DIAGNOSIS — D821 Di George's syndrome: Secondary | ICD-10-CM | POA: Diagnosis not present

## 2021-12-10 DIAGNOSIS — Z952 Presence of prosthetic heart valve: Secondary | ICD-10-CM | POA: Diagnosis not present

## 2021-12-10 DIAGNOSIS — G253 Myoclonus: Secondary | ICD-10-CM | POA: Diagnosis not present

## 2021-12-10 DIAGNOSIS — Z7982 Long term (current) use of aspirin: Secondary | ICD-10-CM | POA: Diagnosis not present

## 2021-12-10 DIAGNOSIS — G8921 Chronic pain due to trauma: Secondary | ICD-10-CM | POA: Diagnosis not present

## 2021-12-10 DIAGNOSIS — Z880 Allergy status to penicillin: Secondary | ICD-10-CM | POA: Diagnosis not present

## 2021-12-10 DIAGNOSIS — N1831 Chronic kidney disease, stage 3a: Secondary | ICD-10-CM | POA: Diagnosis not present

## 2021-12-10 DIAGNOSIS — K219 Gastro-esophageal reflux disease without esophagitis: Secondary | ICD-10-CM | POA: Diagnosis not present

## 2021-12-10 DIAGNOSIS — Q600 Renal agenesis, unilateral: Secondary | ICD-10-CM | POA: Diagnosis not present

## 2021-12-10 DIAGNOSIS — Z881 Allergy status to other antibiotic agents status: Secondary | ICD-10-CM | POA: Diagnosis not present

## 2021-12-10 DIAGNOSIS — N179 Acute kidney failure, unspecified: Secondary | ICD-10-CM | POA: Diagnosis not present

## 2021-12-11 ENCOUNTER — Telehealth: Payer: Self-pay | Admitting: *Deleted

## 2021-12-11 NOTE — Patient Outreach (Signed)
  Care Coordination   12/11/2021 Name: Shelby Cole MRN: [id number] DOB: [birth year]   Care Coordination Outreach Attempts:  A second unsuccessful outreach was attempted today to offer the patient with information about available care coordination services as a benefit of their health plan.     Follow Up Plan:  Additional outreach attempts will be made to offer the patient care coordination information and services.   Encounter Outcome:  Pt. Request to Call Back (pt inpt at [facility])  Care Coordination Interventions Activated:  No   Care Coordination Interventions:  No, not indicated    Raina Mina, RN Care Management Coordinator [facility] [phone]

## 2021-12-24 ENCOUNTER — Telehealth: Payer: Self-pay

## 2021-12-24 NOTE — Patient Outreach (Signed)
  Care Coordination   12/24/2021 Name: Shelby Cole MRN: [id number] DOB: 12-Feb-1981   Care Coordination Outreach Attempts:  A third unsuccessful outreach was attempted today to offer the patient with information about available care coordination services as a benefit of their health plan.   Follow Up Plan:  No further outreach attempts will be made at this time. We have been unable to contact the patient to offer or enroll patient in care coordination services  Encounter Outcome:  No Answer  Care Coordination Interventions Activated:  No   Care Coordination Interventions:  No, not indicated    Peter Garter RN, BSN,CCM, [facility] [phone]

## 2021-12-27 DIAGNOSIS — I5032 Chronic diastolic (congestive) heart failure: Secondary | ICD-10-CM | POA: Diagnosis not present

## 2022-01-14 DIAGNOSIS — M5416 Radiculopathy, lumbar region: Secondary | ICD-10-CM | POA: Diagnosis not present

## 2022-01-29 DIAGNOSIS — M41115 Juvenile idiopathic scoliosis, thoracolumbar region: Secondary | ICD-10-CM | POA: Diagnosis not present

## 2022-01-29 DIAGNOSIS — M5416 Radiculopathy, lumbar region: Secondary | ICD-10-CM | POA: Diagnosis not present

## 2022-03-05 IMAGING — RF DG ESOPHAGUS
10 of 13 series · 14 of 24 positions shown · non-contrast
Comparison: No pertinent prior exams are available for comparison.

CLINICAL DATA: Dysphagia, unspecified type. Constipation,
unspecified type. Additional provided: Patient reports feeling as
though foods are becoming stuck in the region of the throat for 1
year, progressive symptoms

EXAM:
ESOPHOGRAM / BARIUM SWALLOW / BARIUM TABLET STUDY
TECHNIQUE: Combined double contrast and single contrast examination performed
using effervescent crystals, thick barium liquid, and thin barium
liquid. The patient was observed with fluoroscopy swallowing a 13 mm
barium sulphate tablet.
FLUOROSCOPY TIME:  Fluoroscopy Time:  3 minutes, 24 seconds
Radiation Exposure Index (if provided by the fluoroscopic device):
57.90 mGy
Number of Acquired Spot Images: 5

[Series 1: cp_standard · 0.35mm/px · 2 of 114 frames shown (1 of 8)]
[frame 18/114]
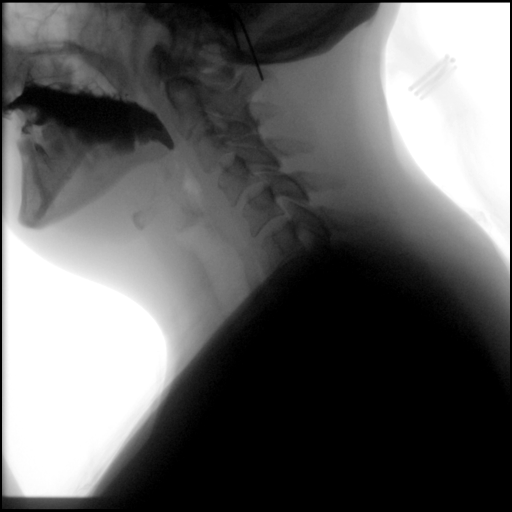
[frame 114/114]
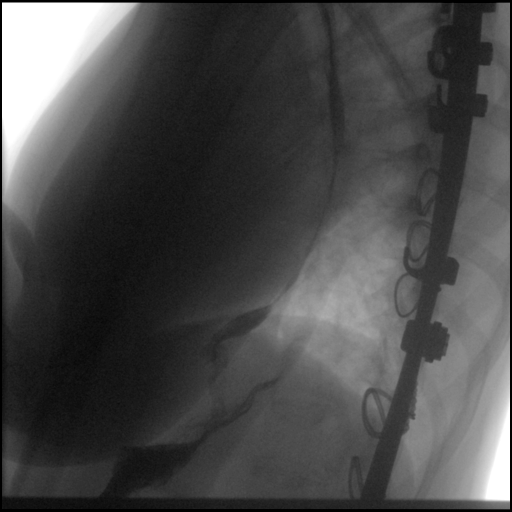

[Series 2: cp_standard · 0.35mm/px · 1 of 130 frames shown (2 of 8)]
[frame 49/130]
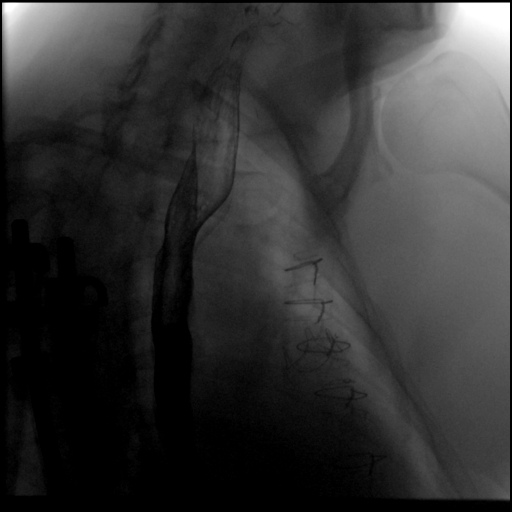

[Series 3: cp_standard · 0.18mm/px · 1 of 1 slices shown (3 of 8)]
[im 1/1]
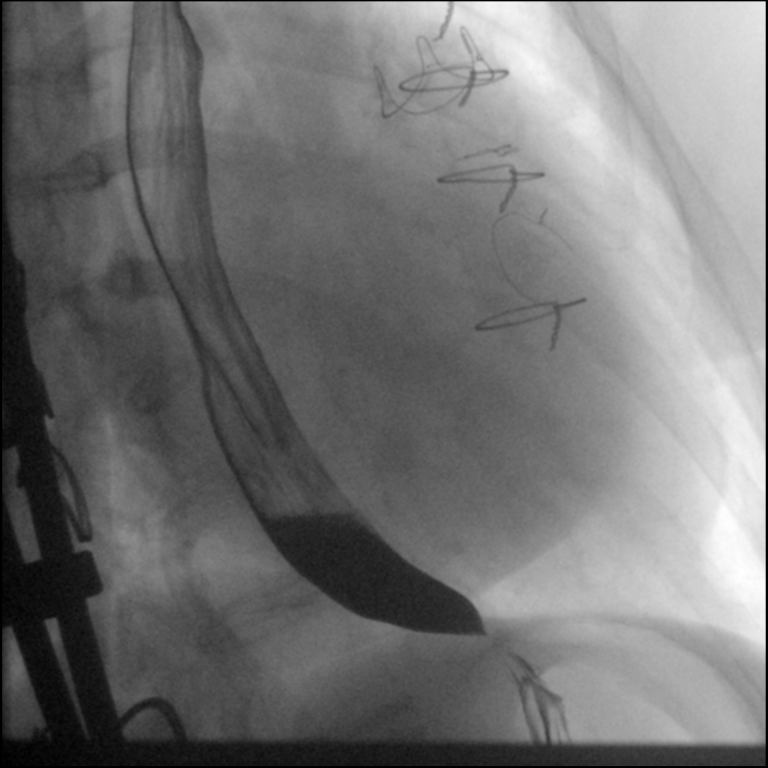

[Series 4: fluoro_barium 2fps_bw · 0.18mm/px · 1 of 1 slices shown (1 of 2)]
[im 1/1]
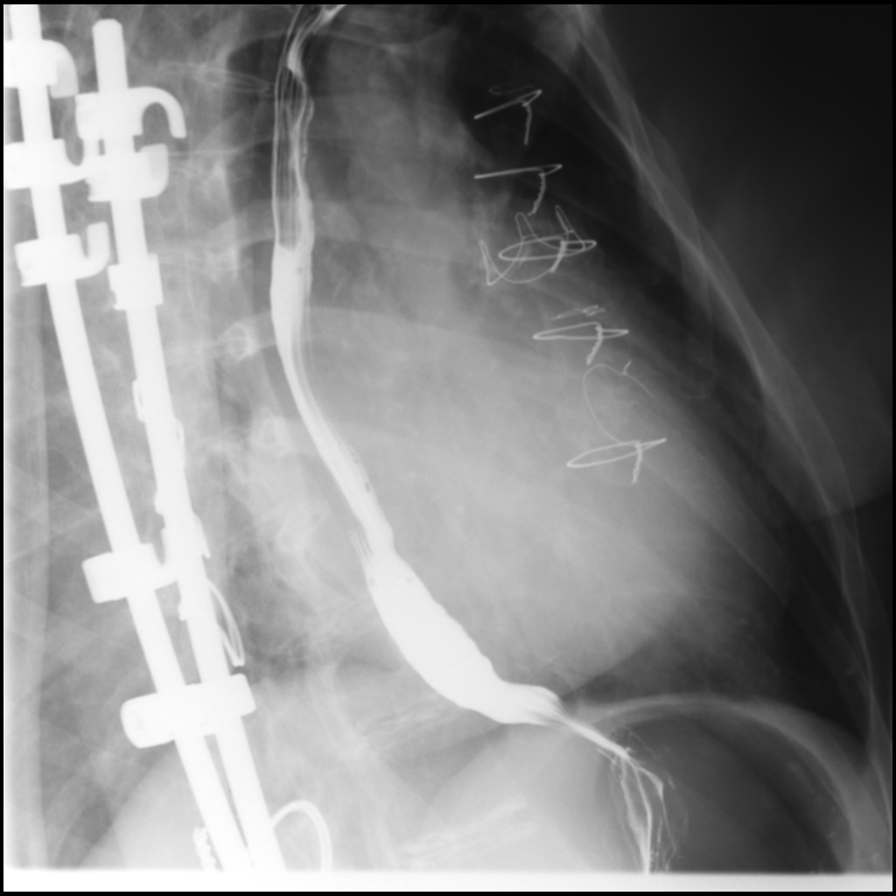

[Series 6: cp_standard · 0.35mm/px · 2 of 64 frames shown (4 of 8)]
[frame 1/64]
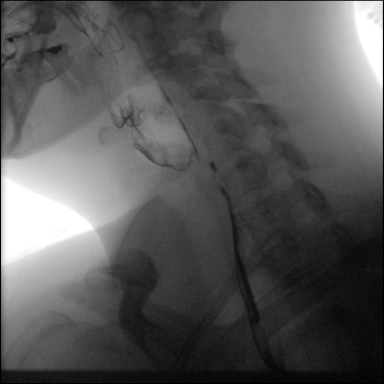
[frame 55/64]
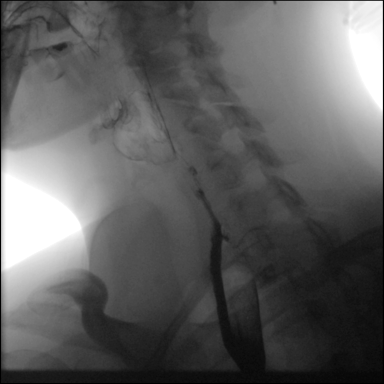

[Series 7: fluoro_barium 2fps_bw · 0.18mm/px · 1 of 1 slices shown (2 of 2)]
[im 1/1]
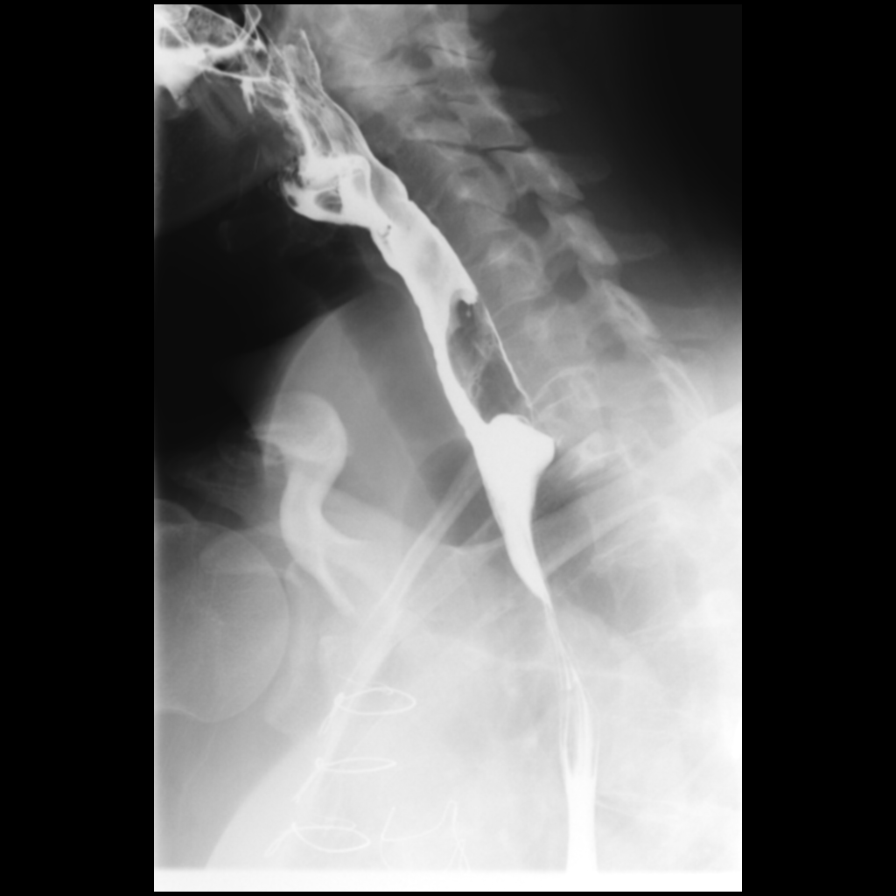

[Series 9: cp_standard · 0.35mm/px · 2 of 64 frames shown (5 of 8)]
[frame 33/64]
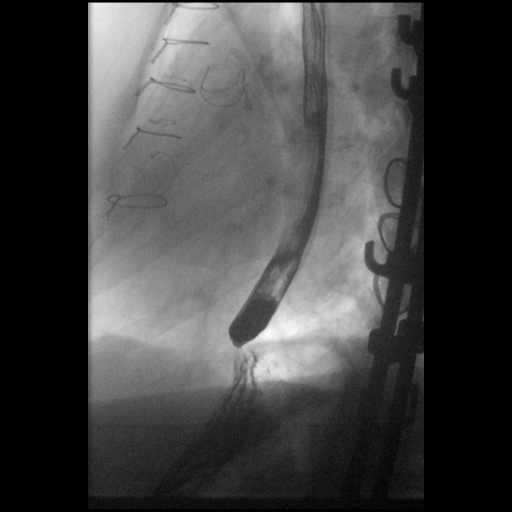
[frame 57/64]
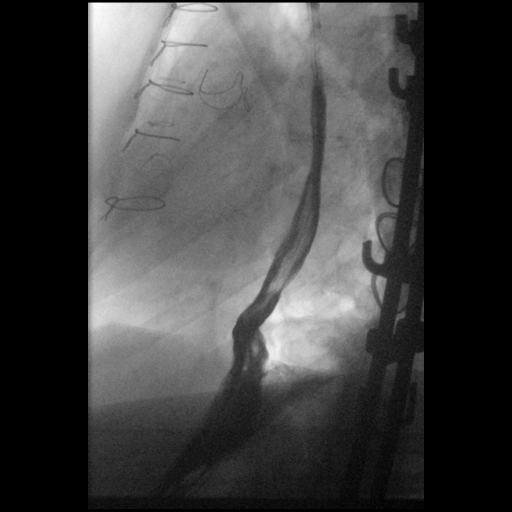

[Series 11: cp_standard · 0.35mm/px · 2 of 77 frames shown (6 of 8)]
[frame 39/77]
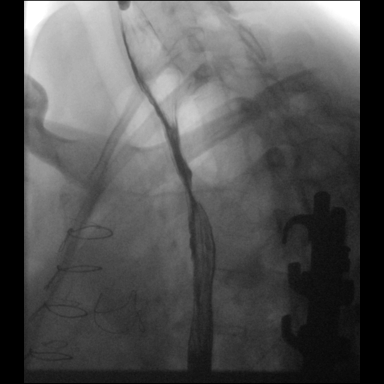
[frame 42/77]
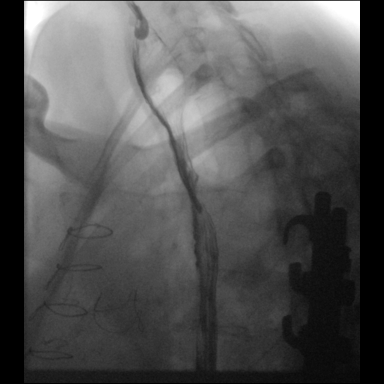

[Series 12: cp_standard · 0.18mm/px · 1 of 64 frames shown (7 of 8)]
[frame 10/64]
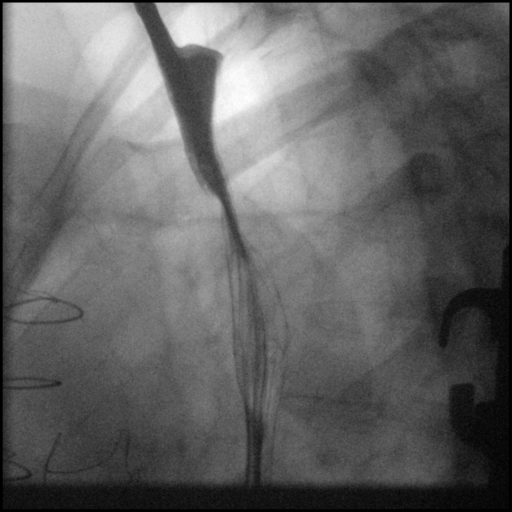

[Series 13: cp_standard · 0.09mm/px · 1 of 1 slices shown (8 of 8)]
[im 1/1]
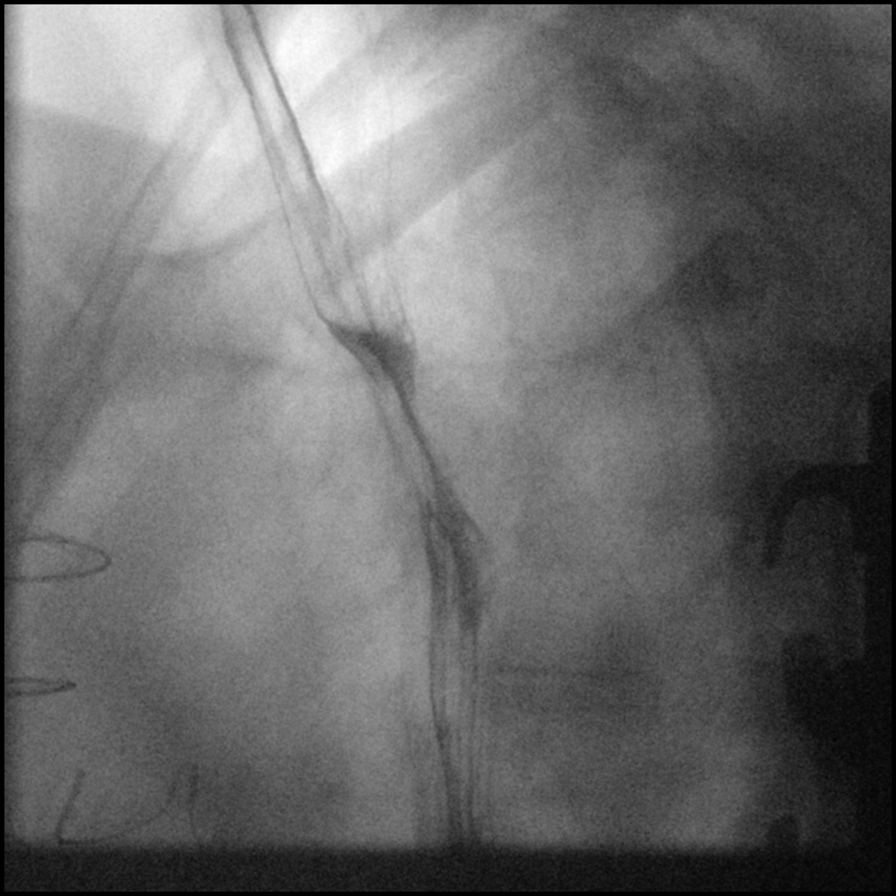

[14 of 24 positions shown; findings below may reference images not displayed]

FINDINGS: There is a small posteriorly directed outpouching arising from the
upper cervical esophagus likely reflecting a Zenker diverticulum.
Otherwise, there is a normal caliber and smooth contour of the
esophagus. No evidence of significant fixed stricture or mass. Mild
intermittent esophageal dysmotility. There is a tiny sliding hiatal
hernia. Small volume gastroesophageal reflux was observed to the
level of the lower esophagus. The patient swallowed a 13 mm barium
tablet, which freely passed into the stomach.
IMPRESSION: Small posteriorly directed outpouching arising from the upper
cervical esophagus, likely reflecting a Zenker diverticulum.

Mild nonspecific intermittent esophageal dysmotility.

Tiny sliding hiatal hernia.

Small volume gastroesophageal reflux to the level of the lower
esophagus.

No evidence of significant fixed stricture. The patient swallowed a
13 mm barium tablet, which freely passed into the stomach.

## 2022-04-02 DIAGNOSIS — I5023 Acute on chronic systolic (congestive) heart failure: Secondary | ICD-10-CM | POA: Diagnosis not present

## 2022-04-02 DIAGNOSIS — G2581 Restless legs syndrome: Secondary | ICD-10-CM | POA: Diagnosis not present

## 2022-04-02 DIAGNOSIS — G253 Myoclonus: Secondary | ICD-10-CM | POA: Diagnosis not present

## 2022-04-02 DIAGNOSIS — Q600 Renal agenesis, unilateral: Secondary | ICD-10-CM | POA: Diagnosis not present

## 2022-04-02 DIAGNOSIS — Z952 Presence of prosthetic heart valve: Secondary | ICD-10-CM | POA: Diagnosis not present

## 2022-04-02 DIAGNOSIS — Z8679 Personal history of other diseases of the circulatory system: Secondary | ICD-10-CM | POA: Diagnosis not present

## 2022-04-02 DIAGNOSIS — Q675 Congenital deformity of spine: Secondary | ICD-10-CM | POA: Diagnosis not present

## 2022-04-02 DIAGNOSIS — Z880 Allergy status to penicillin: Secondary | ICD-10-CM | POA: Diagnosis not present

## 2022-04-02 DIAGNOSIS — Z885 Allergy status to narcotic agent status: Secondary | ICD-10-CM | POA: Diagnosis not present

## 2022-04-02 DIAGNOSIS — D821 Di George's syndrome: Secondary | ICD-10-CM | POA: Diagnosis not present

## 2022-04-02 DIAGNOSIS — Z8774 Personal history of (corrected) congenital malformations of heart and circulatory system: Secondary | ICD-10-CM | POA: Diagnosis not present

## 2022-04-02 DIAGNOSIS — R931 Abnormal findings on diagnostic imaging of heart and coronary circulation: Secondary | ICD-10-CM | POA: Diagnosis not present

## 2022-04-02 DIAGNOSIS — I5033 Acute on chronic diastolic (congestive) heart failure: Secondary | ICD-10-CM | POA: Diagnosis not present

## 2022-04-02 DIAGNOSIS — Q213 Tetralogy of Fallot: Secondary | ICD-10-CM | POA: Diagnosis not present

## 2022-04-02 DIAGNOSIS — K219 Gastro-esophageal reflux disease without esophagitis: Secondary | ICD-10-CM | POA: Diagnosis not present

## 2022-04-02 DIAGNOSIS — Q249 Congenital malformation of heart, unspecified: Secondary | ICD-10-CM | POA: Diagnosis not present

## 2022-04-02 DIAGNOSIS — N1831 Chronic kidney disease, stage 3a: Secondary | ICD-10-CM | POA: Diagnosis not present

## 2022-04-02 DIAGNOSIS — Z79899 Other long term (current) drug therapy: Secondary | ICD-10-CM | POA: Diagnosis not present

## 2022-04-02 DIAGNOSIS — J984 Other disorders of lung: Secondary | ICD-10-CM | POA: Diagnosis not present

## 2022-04-02 DIAGNOSIS — R918 Other nonspecific abnormal finding of lung field: Secondary | ICD-10-CM | POA: Diagnosis not present

## 2022-04-02 DIAGNOSIS — Z883 Allergy status to other anti-infective agents status: Secondary | ICD-10-CM | POA: Diagnosis not present

## 2022-04-08 DIAGNOSIS — I5023 Acute on chronic systolic (congestive) heart failure: Secondary | ICD-10-CM | POA: Diagnosis not present

## 2022-04-10 DIAGNOSIS — I5023 Acute on chronic systolic (congestive) heart failure: Secondary | ICD-10-CM | POA: Diagnosis not present

## 2022-04-17 DIAGNOSIS — M5416 Radiculopathy, lumbar region: Secondary | ICD-10-CM | POA: Diagnosis not present

## 2022-04-17 DIAGNOSIS — M531 Cervicobrachial syndrome: Secondary | ICD-10-CM | POA: Diagnosis not present

## 2022-04-17 DIAGNOSIS — M5414 Radiculopathy, thoracic region: Secondary | ICD-10-CM | POA: Diagnosis not present

## 2022-04-17 DIAGNOSIS — M9903 Segmental and somatic dysfunction of lumbar region: Secondary | ICD-10-CM | POA: Diagnosis not present

## 2022-04-17 DIAGNOSIS — M9902 Segmental and somatic dysfunction of thoracic region: Secondary | ICD-10-CM | POA: Diagnosis not present

## 2022-04-17 DIAGNOSIS — M9901 Segmental and somatic dysfunction of cervical region: Secondary | ICD-10-CM | POA: Diagnosis not present

## 2022-04-22 DIAGNOSIS — M5416 Radiculopathy, lumbar region: Secondary | ICD-10-CM | POA: Diagnosis not present

## 2022-04-26 DIAGNOSIS — Q255 Atresia of pulmonary artery: Secondary | ICD-10-CM | POA: Diagnosis not present

## 2022-04-29 DIAGNOSIS — M5416 Radiculopathy, lumbar region: Secondary | ICD-10-CM | POA: Diagnosis not present

## 2022-04-29 DIAGNOSIS — M41115 Juvenile idiopathic scoliosis, thoracolumbar region: Secondary | ICD-10-CM | POA: Diagnosis not present

## 2022-05-23 DIAGNOSIS — I519 Heart disease, unspecified: Secondary | ICD-10-CM | POA: Diagnosis not present

## 2022-05-23 DIAGNOSIS — Q213 Tetralogy of Fallot: Secondary | ICD-10-CM | POA: Diagnosis not present

## 2022-05-23 DIAGNOSIS — Z954 Presence of other heart-valve replacement: Secondary | ICD-10-CM | POA: Diagnosis not present

## 2022-05-23 DIAGNOSIS — Z8679 Personal history of other diseases of the circulatory system: Secondary | ICD-10-CM | POA: Diagnosis not present

## 2022-05-23 DIAGNOSIS — Q9381 Velo-cardio-facial syndrome: Secondary | ICD-10-CM | POA: Diagnosis not present

## 2022-06-04 DIAGNOSIS — G2581 Restless legs syndrome: Secondary | ICD-10-CM | POA: Diagnosis not present

## 2022-06-04 DIAGNOSIS — D696 Thrombocytopenia, unspecified: Secondary | ICD-10-CM | POA: Diagnosis not present

## 2022-06-04 DIAGNOSIS — Z952 Presence of prosthetic heart valve: Secondary | ICD-10-CM | POA: Diagnosis not present

## 2022-06-04 DIAGNOSIS — Z885 Allergy status to narcotic agent status: Secondary | ICD-10-CM | POA: Diagnosis not present

## 2022-06-04 DIAGNOSIS — Z880 Allergy status to penicillin: Secondary | ICD-10-CM | POA: Diagnosis not present

## 2022-06-04 DIAGNOSIS — D821 Di George's syndrome: Secondary | ICD-10-CM | POA: Diagnosis not present

## 2022-06-04 DIAGNOSIS — I079 Rheumatic tricuspid valve disease, unspecified: Secondary | ICD-10-CM | POA: Diagnosis not present

## 2022-06-04 DIAGNOSIS — Z7982 Long term (current) use of aspirin: Secondary | ICD-10-CM | POA: Diagnosis not present

## 2022-06-04 DIAGNOSIS — I5032 Chronic diastolic (congestive) heart failure: Secondary | ICD-10-CM | POA: Diagnosis not present

## 2022-06-04 DIAGNOSIS — G8929 Other chronic pain: Secondary | ICD-10-CM | POA: Diagnosis not present

## 2022-06-04 DIAGNOSIS — K219 Gastro-esophageal reflux disease without esophagitis: Secondary | ICD-10-CM | POA: Diagnosis not present

## 2022-06-04 DIAGNOSIS — Q213 Tetralogy of Fallot: Secondary | ICD-10-CM | POA: Diagnosis not present

## 2022-06-04 DIAGNOSIS — Q600 Renal agenesis, unilateral: Secondary | ICD-10-CM | POA: Diagnosis not present

## 2022-06-04 DIAGNOSIS — Z881 Allergy status to other antibiotic agents status: Secondary | ICD-10-CM | POA: Diagnosis not present

## 2022-06-04 DIAGNOSIS — Z79891 Long term (current) use of opiate analgesic: Secondary | ICD-10-CM | POA: Diagnosis not present

## 2022-06-04 DIAGNOSIS — I5041 Acute combined systolic (congestive) and diastolic (congestive) heart failure: Secondary | ICD-10-CM | POA: Diagnosis not present

## 2022-06-04 DIAGNOSIS — F32A Depression, unspecified: Secondary | ICD-10-CM | POA: Diagnosis not present

## 2022-06-04 DIAGNOSIS — E861 Hypovolemia: Secondary | ICD-10-CM | POA: Diagnosis not present

## 2022-06-04 DIAGNOSIS — I519 Heart disease, unspecified: Secondary | ICD-10-CM | POA: Diagnosis not present

## 2022-06-04 DIAGNOSIS — E8770 Fluid overload, unspecified: Secondary | ICD-10-CM | POA: Diagnosis not present

## 2022-06-04 DIAGNOSIS — I952 Hypotension due to drugs: Secondary | ICD-10-CM | POA: Diagnosis not present

## 2022-06-04 DIAGNOSIS — Z954 Presence of other heart-valve replacement: Secondary | ICD-10-CM | POA: Diagnosis not present

## 2022-06-04 DIAGNOSIS — Z7984 Long term (current) use of oral hypoglycemic drugs: Secondary | ICD-10-CM | POA: Diagnosis not present

## 2022-06-04 DIAGNOSIS — Q675 Congenital deformity of spine: Secondary | ICD-10-CM | POA: Diagnosis not present

## 2022-06-04 DIAGNOSIS — Z79899 Other long term (current) drug therapy: Secondary | ICD-10-CM | POA: Diagnosis not present

## 2022-06-04 DIAGNOSIS — Z8774 Personal history of (corrected) congenital malformations of heart and circulatory system: Secondary | ICD-10-CM | POA: Diagnosis not present

## 2022-06-04 DIAGNOSIS — N183 Chronic kidney disease, stage 3 unspecified: Secondary | ICD-10-CM | POA: Diagnosis not present

## 2022-06-04 DIAGNOSIS — N1831 Chronic kidney disease, stage 3a: Secondary | ICD-10-CM | POA: Diagnosis not present

## 2022-07-02 DIAGNOSIS — D6800 Von Willebrand disease, unspecified: Secondary | ICD-10-CM | POA: Diagnosis not present

## 2022-07-02 DIAGNOSIS — N1831 Chronic kidney disease, stage 3a: Secondary | ICD-10-CM | POA: Diagnosis not present

## 2022-07-02 DIAGNOSIS — I5032 Chronic diastolic (congestive) heart failure: Secondary | ICD-10-CM | POA: Diagnosis not present

## 2022-07-25 DIAGNOSIS — Q600 Renal agenesis, unilateral: Secondary | ICD-10-CM | POA: Diagnosis not present

## 2022-07-25 DIAGNOSIS — Z952 Presence of prosthetic heart valve: Secondary | ICD-10-CM | POA: Diagnosis not present

## 2022-07-25 DIAGNOSIS — N1831 Chronic kidney disease, stage 3a: Secondary | ICD-10-CM | POA: Diagnosis not present

## 2022-07-25 DIAGNOSIS — I5022 Chronic systolic (congestive) heart failure: Secondary | ICD-10-CM | POA: Diagnosis not present

## 2022-07-25 DIAGNOSIS — I5042 Chronic combined systolic (congestive) and diastolic (congestive) heart failure: Secondary | ICD-10-CM | POA: Diagnosis not present

## 2022-07-25 DIAGNOSIS — Q224 Congenital tricuspid stenosis: Secondary | ICD-10-CM | POA: Diagnosis not present

## 2022-07-25 DIAGNOSIS — Z8774 Personal history of (corrected) congenital malformations of heart and circulatory system: Secondary | ICD-10-CM | POA: Diagnosis not present

## 2022-07-29 DIAGNOSIS — M5416 Radiculopathy, lumbar region: Secondary | ICD-10-CM | POA: Diagnosis not present

## 2022-07-29 DIAGNOSIS — M41115 Juvenile idiopathic scoliosis, thoracolumbar region: Secondary | ICD-10-CM | POA: Diagnosis not present

## 2022-08-07 DIAGNOSIS — M9902 Segmental and somatic dysfunction of thoracic region: Secondary | ICD-10-CM | POA: Diagnosis not present

## 2022-08-07 DIAGNOSIS — M9903 Segmental and somatic dysfunction of lumbar region: Secondary | ICD-10-CM | POA: Diagnosis not present

## 2022-08-07 DIAGNOSIS — M5414 Radiculopathy, thoracic region: Secondary | ICD-10-CM | POA: Diagnosis not present

## 2022-08-07 DIAGNOSIS — M9901 Segmental and somatic dysfunction of cervical region: Secondary | ICD-10-CM | POA: Diagnosis not present

## 2022-08-07 DIAGNOSIS — M531 Cervicobrachial syndrome: Secondary | ICD-10-CM | POA: Diagnosis not present

## 2022-08-07 DIAGNOSIS — M5416 Radiculopathy, lumbar region: Secondary | ICD-10-CM | POA: Diagnosis not present

## 2022-08-16 DIAGNOSIS — M5416 Radiculopathy, lumbar region: Secondary | ICD-10-CM | POA: Diagnosis not present

## 2022-08-22 DIAGNOSIS — Q213 Tetralogy of Fallot: Secondary | ICD-10-CM | POA: Diagnosis not present

## 2022-08-22 DIAGNOSIS — I5023 Acute on chronic systolic (congestive) heart failure: Secondary | ICD-10-CM | POA: Diagnosis not present

## 2022-08-22 DIAGNOSIS — I519 Heart disease, unspecified: Secondary | ICD-10-CM | POA: Diagnosis not present

## 2022-08-22 DIAGNOSIS — Z954 Presence of other heart-valve replacement: Secondary | ICD-10-CM | POA: Diagnosis not present

## 2022-08-27 DIAGNOSIS — G8929 Other chronic pain: Secondary | ICD-10-CM | POA: Diagnosis not present

## 2022-08-27 DIAGNOSIS — Q600 Renal agenesis, unilateral: Secondary | ICD-10-CM | POA: Diagnosis not present

## 2022-08-27 DIAGNOSIS — Z881 Allergy status to other antibiotic agents status: Secondary | ICD-10-CM | POA: Diagnosis not present

## 2022-08-27 DIAGNOSIS — Z952 Presence of prosthetic heart valve: Secondary | ICD-10-CM | POA: Diagnosis not present

## 2022-08-27 DIAGNOSIS — G2581 Restless legs syndrome: Secondary | ICD-10-CM | POA: Diagnosis not present

## 2022-08-27 DIAGNOSIS — Q9381 Velo-cardio-facial syndrome: Secondary | ICD-10-CM | POA: Diagnosis not present

## 2022-08-27 DIAGNOSIS — D6800 Von Willebrand disease, unspecified: Secondary | ICD-10-CM | POA: Diagnosis not present

## 2022-08-27 DIAGNOSIS — G253 Myoclonus: Secondary | ICD-10-CM | POA: Diagnosis not present

## 2022-08-27 DIAGNOSIS — Z7982 Long term (current) use of aspirin: Secondary | ICD-10-CM | POA: Diagnosis not present

## 2022-08-27 DIAGNOSIS — Q675 Congenital deformity of spine: Secondary | ICD-10-CM | POA: Diagnosis not present

## 2022-08-27 DIAGNOSIS — Z888 Allergy status to other drugs, medicaments and biological substances status: Secondary | ICD-10-CM | POA: Diagnosis not present

## 2022-08-27 DIAGNOSIS — F32A Depression, unspecified: Secondary | ICD-10-CM | POA: Diagnosis not present

## 2022-08-27 DIAGNOSIS — Z79899 Other long term (current) drug therapy: Secondary | ICD-10-CM | POA: Diagnosis not present

## 2022-08-27 DIAGNOSIS — E8770 Fluid overload, unspecified: Secondary | ICD-10-CM | POA: Diagnosis not present

## 2022-08-27 DIAGNOSIS — I5033 Acute on chronic diastolic (congestive) heart failure: Secondary | ICD-10-CM | POA: Diagnosis not present

## 2022-08-27 DIAGNOSIS — Z880 Allergy status to penicillin: Secondary | ICD-10-CM | POA: Diagnosis not present

## 2022-08-27 DIAGNOSIS — Z8774 Personal history of (corrected) congenital malformations of heart and circulatory system: Secondary | ICD-10-CM | POA: Diagnosis not present

## 2022-08-27 DIAGNOSIS — I50813 Acute on chronic right heart failure: Secondary | ICD-10-CM | POA: Diagnosis not present

## 2022-08-27 DIAGNOSIS — I517 Cardiomegaly: Secondary | ICD-10-CM | POA: Diagnosis not present

## 2022-08-27 DIAGNOSIS — N1831 Chronic kidney disease, stage 3a: Secondary | ICD-10-CM | POA: Diagnosis not present

## 2022-08-27 DIAGNOSIS — Z7984 Long term (current) use of oral hypoglycemic drugs: Secondary | ICD-10-CM | POA: Diagnosis not present

## 2022-09-06 DIAGNOSIS — E8770 Fluid overload, unspecified: Secondary | ICD-10-CM | POA: Diagnosis not present

## 2022-09-06 DIAGNOSIS — I50813 Acute on chronic right heart failure: Secondary | ICD-10-CM | POA: Diagnosis not present

## 2022-09-23 DIAGNOSIS — R7303 Prediabetes: Secondary | ICD-10-CM | POA: Diagnosis not present

## 2022-09-23 DIAGNOSIS — L299 Pruritus, unspecified: Secondary | ICD-10-CM | POA: Diagnosis not present

## 2022-09-23 DIAGNOSIS — N1831 Chronic kidney disease, stage 3a: Secondary | ICD-10-CM | POA: Diagnosis not present

## 2022-09-23 DIAGNOSIS — Z131 Encounter for screening for diabetes mellitus: Secondary | ICD-10-CM | POA: Diagnosis not present

## 2022-09-23 DIAGNOSIS — R682 Dry mouth, unspecified: Secondary | ICD-10-CM | POA: Diagnosis not present

## 2022-09-23 DIAGNOSIS — R631 Polydipsia: Secondary | ICD-10-CM | POA: Diagnosis not present

## 2022-09-23 DIAGNOSIS — I5032 Chronic diastolic (congestive) heart failure: Secondary | ICD-10-CM | POA: Diagnosis not present

## 2022-10-11 DIAGNOSIS — M5416 Radiculopathy, lumbar region: Secondary | ICD-10-CM | POA: Diagnosis not present

## 2022-10-11 DIAGNOSIS — M41115 Juvenile idiopathic scoliosis, thoracolumbar region: Secondary | ICD-10-CM | POA: Diagnosis not present

## 2022-10-14 DIAGNOSIS — E8770 Fluid overload, unspecified: Secondary | ICD-10-CM | POA: Diagnosis not present

## 2022-10-14 DIAGNOSIS — I50813 Acute on chronic right heart failure: Secondary | ICD-10-CM | POA: Diagnosis not present

## 2022-10-22 DIAGNOSIS — I519 Heart disease, unspecified: Secondary | ICD-10-CM | POA: Diagnosis not present

## 2022-11-04 DIAGNOSIS — I519 Heart disease, unspecified: Secondary | ICD-10-CM | POA: Diagnosis not present

## 2022-11-13 DIAGNOSIS — M531 Cervicobrachial syndrome: Secondary | ICD-10-CM | POA: Diagnosis not present

## 2022-11-13 DIAGNOSIS — M5416 Radiculopathy, lumbar region: Secondary | ICD-10-CM | POA: Diagnosis not present

## 2022-11-13 DIAGNOSIS — M9902 Segmental and somatic dysfunction of thoracic region: Secondary | ICD-10-CM | POA: Diagnosis not present

## 2022-11-13 DIAGNOSIS — M9901 Segmental and somatic dysfunction of cervical region: Secondary | ICD-10-CM | POA: Diagnosis not present

## 2022-11-13 DIAGNOSIS — M9903 Segmental and somatic dysfunction of lumbar region: Secondary | ICD-10-CM | POA: Diagnosis not present

## 2022-11-13 DIAGNOSIS — M5414 Radiculopathy, thoracic region: Secondary | ICD-10-CM | POA: Diagnosis not present

## 2022-11-18 DIAGNOSIS — I519 Heart disease, unspecified: Secondary | ICD-10-CM | POA: Diagnosis not present

## 2022-11-18 DIAGNOSIS — M5416 Radiculopathy, lumbar region: Secondary | ICD-10-CM | POA: Diagnosis not present

## 2022-11-29 DIAGNOSIS — M9902 Segmental and somatic dysfunction of thoracic region: Secondary | ICD-10-CM | POA: Diagnosis not present

## 2022-11-29 DIAGNOSIS — M5416 Radiculopathy, lumbar region: Secondary | ICD-10-CM | POA: Diagnosis not present

## 2022-11-29 DIAGNOSIS — M9903 Segmental and somatic dysfunction of lumbar region: Secondary | ICD-10-CM | POA: Diagnosis not present

## 2022-11-29 DIAGNOSIS — M5414 Radiculopathy, thoracic region: Secondary | ICD-10-CM | POA: Diagnosis not present

## 2022-11-29 DIAGNOSIS — M9901 Segmental and somatic dysfunction of cervical region: Secondary | ICD-10-CM | POA: Diagnosis not present

## 2022-11-29 DIAGNOSIS — M531 Cervicobrachial syndrome: Secondary | ICD-10-CM | POA: Diagnosis not present

## 2022-12-02 DIAGNOSIS — N1831 Chronic kidney disease, stage 3a: Secondary | ICD-10-CM | POA: Diagnosis not present

## 2022-12-02 DIAGNOSIS — I130 Hypertensive heart and chronic kidney disease with heart failure and stage 1 through stage 4 chronic kidney disease, or unspecified chronic kidney disease: Secondary | ICD-10-CM | POA: Diagnosis not present

## 2022-12-02 DIAGNOSIS — Q213 Tetralogy of Fallot: Secondary | ICD-10-CM | POA: Diagnosis not present

## 2022-12-02 DIAGNOSIS — R7303 Prediabetes: Secondary | ICD-10-CM | POA: Diagnosis not present

## 2022-12-02 DIAGNOSIS — Z136 Encounter for screening for cardiovascular disorders: Secondary | ICD-10-CM | POA: Diagnosis not present

## 2022-12-02 DIAGNOSIS — D821 Di George's syndrome: Secondary | ICD-10-CM | POA: Diagnosis not present

## 2022-12-02 DIAGNOSIS — I5032 Chronic diastolic (congestive) heart failure: Secondary | ICD-10-CM | POA: Diagnosis not present

## 2022-12-02 DIAGNOSIS — G253 Myoclonus: Secondary | ICD-10-CM | POA: Diagnosis not present

## 2022-12-02 DIAGNOSIS — Z0000 Encounter for general adult medical examination without abnormal findings: Secondary | ICD-10-CM | POA: Diagnosis not present

## 2022-12-02 LAB — LAB REPORT - SCANNED
A1c: 6.4
EGFR: 20

## 2022-12-06 DIAGNOSIS — M9903 Segmental and somatic dysfunction of lumbar region: Secondary | ICD-10-CM | POA: Diagnosis not present

## 2022-12-06 DIAGNOSIS — M9902 Segmental and somatic dysfunction of thoracic region: Secondary | ICD-10-CM | POA: Diagnosis not present

## 2022-12-06 DIAGNOSIS — M5414 Radiculopathy, thoracic region: Secondary | ICD-10-CM | POA: Diagnosis not present

## 2022-12-06 DIAGNOSIS — M5416 Radiculopathy, lumbar region: Secondary | ICD-10-CM | POA: Diagnosis not present

## 2022-12-06 DIAGNOSIS — M531 Cervicobrachial syndrome: Secondary | ICD-10-CM | POA: Diagnosis not present

## 2022-12-06 DIAGNOSIS — M9901 Segmental and somatic dysfunction of cervical region: Secondary | ICD-10-CM | POA: Diagnosis not present

## 2022-12-10 DIAGNOSIS — I519 Heart disease, unspecified: Secondary | ICD-10-CM | POA: Diagnosis not present

## 2022-12-25 DIAGNOSIS — I519 Heart disease, unspecified: Secondary | ICD-10-CM | POA: Diagnosis not present

## 2023-01-03 DIAGNOSIS — M5416 Radiculopathy, lumbar region: Secondary | ICD-10-CM | POA: Diagnosis not present

## 2023-01-03 DIAGNOSIS — M41115 Juvenile idiopathic scoliosis, thoracolumbar region: Secondary | ICD-10-CM | POA: Diagnosis not present

## 2023-01-23 DIAGNOSIS — Z954 Presence of other heart-valve replacement: Secondary | ICD-10-CM | POA: Diagnosis not present

## 2023-01-23 DIAGNOSIS — Z8774 Personal history of (corrected) congenital malformations of heart and circulatory system: Secondary | ICD-10-CM | POA: Diagnosis not present

## 2023-01-23 DIAGNOSIS — I519 Heart disease, unspecified: Secondary | ICD-10-CM | POA: Diagnosis not present

## 2023-01-23 DIAGNOSIS — G253 Myoclonus: Secondary | ICD-10-CM | POA: Diagnosis not present

## 2023-01-23 DIAGNOSIS — I5022 Chronic systolic (congestive) heart failure: Secondary | ICD-10-CM | POA: Diagnosis not present

## 2023-01-23 DIAGNOSIS — J984 Other disorders of lung: Secondary | ICD-10-CM | POA: Diagnosis not present

## 2023-02-18 DIAGNOSIS — M5416 Radiculopathy, lumbar region: Secondary | ICD-10-CM | POA: Diagnosis not present

## 2023-03-13 DIAGNOSIS — M5416 Radiculopathy, lumbar region: Secondary | ICD-10-CM | POA: Diagnosis not present

## 2023-03-13 DIAGNOSIS — I5023 Acute on chronic systolic (congestive) heart failure: Secondary | ICD-10-CM | POA: Diagnosis not present

## 2023-03-13 DIAGNOSIS — M41115 Juvenile idiopathic scoliosis, thoracolumbar region: Secondary | ICD-10-CM | POA: Diagnosis not present

## 2023-05-20 DIAGNOSIS — M5416 Radiculopathy, lumbar region: Secondary | ICD-10-CM | POA: Diagnosis not present

## 2023-05-29 DIAGNOSIS — I5022 Chronic systolic (congestive) heart failure: Secondary | ICD-10-CM | POA: Diagnosis not present

## 2023-06-10 DIAGNOSIS — M9903 Segmental and somatic dysfunction of lumbar region: Secondary | ICD-10-CM | POA: Diagnosis not present

## 2023-06-10 DIAGNOSIS — M9902 Segmental and somatic dysfunction of thoracic region: Secondary | ICD-10-CM | POA: Diagnosis not present

## 2023-06-10 DIAGNOSIS — M9901 Segmental and somatic dysfunction of cervical region: Secondary | ICD-10-CM | POA: Diagnosis not present

## 2023-06-10 DIAGNOSIS — M531 Cervicobrachial syndrome: Secondary | ICD-10-CM | POA: Diagnosis not present

## 2023-06-10 DIAGNOSIS — M5416 Radiculopathy, lumbar region: Secondary | ICD-10-CM | POA: Diagnosis not present

## 2023-06-10 DIAGNOSIS — M5414 Radiculopathy, thoracic region: Secondary | ICD-10-CM | POA: Diagnosis not present

## 2023-06-11 DIAGNOSIS — M5416 Radiculopathy, lumbar region: Secondary | ICD-10-CM | POA: Diagnosis not present

## 2023-06-11 DIAGNOSIS — M41115 Juvenile idiopathic scoliosis, thoracolumbar region: Secondary | ICD-10-CM | POA: Diagnosis not present

## 2023-06-30 DIAGNOSIS — I5022 Chronic systolic (congestive) heart failure: Secondary | ICD-10-CM | POA: Diagnosis not present

## 2023-07-08 DIAGNOSIS — M5442 Lumbago with sciatica, left side: Secondary | ICD-10-CM | POA: Diagnosis not present

## 2023-08-10 DEATH — deceased
